# Patient Record
Sex: Female | Born: 1945 | Race: Black or African American | Hispanic: No | Marital: Married | Smoking: Former smoker
Health system: Southern US, Community
[De-identification: ages and names within clinical notes are randomized; demographics above are authoritative.]

## PROBLEM LIST (undated history)

## (undated) DIAGNOSIS — E039 Hypothyroidism, unspecified: Secondary | ICD-10-CM

## (undated) DIAGNOSIS — E876 Hypokalemia: Secondary | ICD-10-CM

## (undated) DIAGNOSIS — E785 Hyperlipidemia, unspecified: Secondary | ICD-10-CM

## (undated) DIAGNOSIS — I214 Non-ST elevation (NSTEMI) myocardial infarction: Secondary | ICD-10-CM

## (undated) DIAGNOSIS — J439 Emphysema, unspecified: Secondary | ICD-10-CM

## (undated) DIAGNOSIS — D649 Anemia, unspecified: Secondary | ICD-10-CM

## (undated) DIAGNOSIS — M1990 Unspecified osteoarthritis, unspecified site: Secondary | ICD-10-CM

## (undated) HISTORY — DX: Anemia, unspecified: D64.9

## (undated) HISTORY — DX: Hyperlipidemia, unspecified: E78.5

## (undated) HISTORY — PX: ABDOMINAL HYSTERECTOMY: SHX81

## (undated) HISTORY — DX: Hypokalemia: E87.6

## (undated) HISTORY — DX: Non-ST elevation (NSTEMI) myocardial infarction: I21.4

---

## 2003-11-30 ENCOUNTER — Encounter: Admission: RE | Admit: 2003-11-30 | Discharge: 2003-12-25 | Payer: Self-pay | Admitting: Unknown Physician Specialty

## 2008-01-16 DIAGNOSIS — M0579 Rheumatoid arthritis with rheumatoid factor of multiple sites without organ or systems involvement: Secondary | ICD-10-CM | POA: Insufficient documentation

## 2008-07-14 DIAGNOSIS — L659 Nonscarring hair loss, unspecified: Secondary | ICD-10-CM | POA: Insufficient documentation

## 2012-01-25 DIAGNOSIS — E039 Hypothyroidism, unspecified: Secondary | ICD-10-CM | POA: Insufficient documentation

## 2015-04-23 ENCOUNTER — Emergency Department (HOSPITAL_COMMUNITY)
Admission: EM | Admit: 2015-04-23 | Discharge: 2015-04-23 | Disposition: A | Discharge status: Home / Self Care | Payer: Medicare Other | Attending: Emergency Medicine | Admitting: Emergency Medicine

## 2015-04-23 ENCOUNTER — Encounter (HOSPITAL_COMMUNITY): Payer: Self-pay

## 2015-04-23 ENCOUNTER — Emergency Department (HOSPITAL_COMMUNITY): Payer: Medicare Other

## 2015-04-23 DIAGNOSIS — M150 Primary generalized (osteo)arthritis: Secondary | ICD-10-CM

## 2015-04-23 DIAGNOSIS — M159 Polyosteoarthritis, unspecified: Secondary | ICD-10-CM | POA: Insufficient documentation

## 2015-04-23 DIAGNOSIS — M5136 Other intervertebral disc degeneration, lumbar region: Secondary | ICD-10-CM | POA: Insufficient documentation

## 2015-04-23 DIAGNOSIS — M549 Dorsalgia, unspecified: Secondary | ICD-10-CM | POA: Diagnosis present

## 2015-04-23 HISTORY — DX: Unspecified osteoarthritis, unspecified site: M19.90

## 2015-04-23 LAB — URINALYSIS, ROUTINE W REFLEX MICROSCOPIC
Bilirubin Urine: NEGATIVE
GLUCOSE, UA: NEGATIVE mg/dL
Ketones, ur: NEGATIVE mg/dL
Leukocytes, UA: NEGATIVE
Nitrite: NEGATIVE
Protein, ur: NEGATIVE mg/dL
UROBILINOGEN UA: 0.2 mg/dL (ref 0.0–1.0)
pH: 6 (ref 5.0–8.0)

## 2015-04-23 LAB — URINE MICROSCOPIC-ADD ON

## 2015-04-23 MED ORDER — PREDNISONE 50 MG PO TABS
60.0000 mg | ORAL_TABLET | Freq: Once | ORAL | Status: AC
  Administered 2015-04-23: 60 mg via ORAL
  Filled 2015-04-23 (×2): qty 1

## 2015-04-23 MED ORDER — HYDROCODONE-ACETAMINOPHEN 5-325 MG PO TABS: 1.0000 | ORAL_TABLET | Freq: Four times a day (QID) | ORAL | Status: DC | PRN

## 2015-04-23 MED ORDER — ONDANSETRON HCL 4 MG PO TABS
4.0000 mg | ORAL_TABLET | Freq: Once | ORAL | Status: AC
  Administered 2015-04-23: 4 mg via ORAL
  Filled 2015-04-23: qty 1

## 2015-04-23 MED ORDER — HYDROCODONE-ACETAMINOPHEN 5-325 MG PO TABS
1.0000 | ORAL_TABLET | Freq: Once | ORAL | Status: AC
  Administered 2015-04-23: 1 via ORAL
  Filled 2015-04-23: qty 1

## 2015-04-23 NOTE — ED Notes (Signed)
Pt made aware to return if symptoms worsen or if any life threatening symptoms occur.   

## 2015-04-23 NOTE — ED Provider Notes (Signed)
CSN: [id number]     Arrival date & time 04/23/15  7616 History   First MD Initiated Contact with Patient 04/23/15 [phone]     Chief Complaint  Patient presents with  . Back Pain     (Consider location/radiation/quality/duration/timing/severity/associated sxs/prior Treatment) HPI Comments: Patient is a 70 year old female who presents to the emergency department with a complaint of back pain.  The patient states that she has problems with arthritis, has required steroidal injections in her shoulder from time to time. She has problems with her back also from time to time, but for the last 2 weeks the pain has been getting progressively worse. The patient denies any change in her pattern of lifting, pushing, pulling. She does not recall doing any heavy lifting. She denies any excessive stooping or bending. She has been using Tylenol but this has not been effective, so she came to the emergency department today for additional evaluation and management. And she denies any difficulty with urination, she denies fever or chills, she has not noticed any temperature changes or unusual numbness or tingling involving her lower extremities.  Patient is a 69 y.o. female presenting with back pain. The history is provided by the patient.  Back Pain Location:  Lumbar spine Associated symptoms: no fever and no numbness     Past Medical History  Diagnosis Date  . Arthritis    Past Surgical History  Procedure Laterality Date  . Abdominal hysterectomy     No family history on file. History  Substance Use Topics  . Smoking status: Never Smoker   . Smokeless tobacco: Not on file  . Alcohol Use: No   OB History    No data available     Review of Systems  Constitutional: Negative for fever.  Musculoskeletal: Positive for back pain and arthralgias.  Neurological: Negative for numbness.  All other systems reviewed and are negative.     Allergies  Review of patient's allergies indicates no known  allergies.  Home Medications   Prior to Admission medications   Not on File   BP 154/83 mmHg  Pulse 84  Temp(Src) 98.3 F (36.8 C) (Oral)  Ht 5\' 6"  (1.676 m)  Wt 212 lb (96.163 kg)  BMI 34.23 kg/m2  SpO2 96% Physical Exam  Constitutional: She is oriented to person, place, and time. She appears well-developed and well-nourished.  Non-toxic appearance.  HENT:  Head: Normocephalic.  Right Ear: Tympanic membrane and external ear normal.  Left Ear: Tympanic membrane and external ear normal.  Eyes: EOM and lids are normal. Pupils are equal, round, and reactive to light.  Neck: Normal range of motion. Neck supple. Carotid bruit is not present.  Cardiovascular: Normal rate, regular rhythm, normal heart sounds, intact distal pulses and normal pulses.   Pulmonary/Chest: Breath sounds normal. No respiratory distress.  Abdominal: Soft. Bowel sounds are normal. There is no tenderness. There is no guarding.  Abdomen is soft. There is no mass or pulsating mass.  Musculoskeletal:       Lumbar back: She exhibits decreased range of motion and tenderness.       Back:  Radial pulses are 2+ and symmetrical. Dorsalis pedis pulses are 2+ and symmetrical. There no temperature changes of the lower extremities.  There is pain to palpation of the lower mid lumbar spine area. There is also left paraspinal area tenderness with change of position and with palpation.  Lymphadenopathy:       Head (right side): No submandibular adenopathy present.  Head (left side): No submandibular adenopathy present.    She has no cervical adenopathy.  Neurological: She is alert and oriented to person, place, and time. She has normal strength. No cranial nerve deficit or sensory deficit.  Gait is steady and intact. No foot drop noted. No changes in balance. Speech is clear and understandable.  Skin: Skin is warm and dry.  Psychiatric: She has a normal mood and affect. Her speech is normal.  Nursing note and vitals  reviewed.   ED Course  Procedures (including critical care time) Labs Review Labs Reviewed  URINALYSIS, ROUTINE W REFLEX MICROSCOPIC (NOT AT [facility])    Imaging Review No results found.   EKG Interpretation None      MDM  Patient has history of arthritis. She now has increasing pain of the lower back. His been no loss of control of bowel or bladder function. Urinalysis was within normal limits. No evidence of infection or kidney stone.  The x-ray of the lumbar spine reveals spondylosis, with unknown degenerative changes at the L5-S1, L3-L4, and L2-L3 areas. There was also noted some aortic atherosclerosis. I have discussed these findings with the patient in terms which he understands. She will follow with her primary physician. I have encouraged her to develop a treatment plan and pain management plan with her doctor. The patient is using Tylenol and she states this is not helping. I am reluctant to start her on additional anti-inflammatory medications. Patient is advised to use Tylenol every 4 hours for mild pain, prescription for Norco given for more severe pain. I have instructed the patient to discuss this with her primary physician to develop a plan for pain management. Patient is in agreement with this discharge plan.    Final diagnoses:  None    *I have reviewed nursing notes, vital signs, and all appropriate lab and imaging results for this patient.[address]    Ivery Quale, PA-C 04/23/15 1124  Raeford Razor, MD 04/24/15 [phone]

## 2015-04-23 NOTE — Discharge Instructions (Signed)
Your urine test is negative for infection or kidney stone. 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 Please discuss this with your primary physician. Please also discuss pain management with your primary physician. You may use Tylenol Extra Strength every 4 hours. You may use Norco for pain not improved by the Tylenol Extra Strength. Norco may cause drowsiness, please use this medication with caution. Degenerative Disk Disease Degenerative disk disease is a condition caused by the changes that occur in the cushions of the backbone (spinal disks) as you grow older. Spinal disks are soft and compressible disks located between the bones of the spine (vertebrae). They act like shock absorbers. Degenerative disk disease can affect the whole spine. However, the neck and lower back are most commonly affected. Many changes can occur in the spinal disks with aging, such as:  The spinal disks may dry and shrink.  Small tears may occur in the tough, outer covering of the disk (annulus).  The disk space may become smaller due to loss of water.  Abnormal growths in the bone (spurs) may occur. This can put pressure on the nerve roots exiting the spinal canal, causing pain.  The spinal canal may become narrowed. CAUSES  Degenerative disk disease is a condition caused by the changes that occur in the spinal disks with aging. The exact cause is not known, but there is a genetic basis for many patients. Degenerative changes can occur due to loss of fluid in the disk. This makes the disk thinner and reduces the space between the backbones. Small cracks can develop in the outer layer of the disk. This can lead to the breakdown of the disk. You are more likely to get degenerative disk disease if you are overweight. Smoking cigarettes and doing heavy work such as weightlifting can also increase your risk of this condition. Degenerative changes can start after a sudden injury.  Growth of bone spurs can compress the nerve roots and cause pain.  SYMPTOMS  The symptoms vary from person to person. Some people may have no pain, while others have severe pain. The pain may be so severe that it can limit your activities. The location of the pain depends on the part of your backbone that is affected. You will have neck or arm pain if a disk in the neck area is affected. You will have pain in your back, buttocks, or legs if a disk in the lower back is affected. The pain becomes worse while bending, reaching up, or with twisting movements. The pain may start gradually and then get worse as time passes. It may also start after a major or minor injury. You may feel numbness or tingling in the arms or legs.  DIAGNOSIS  Your caregiver will ask you about your symptoms and about activities or habits that may cause the pain. He or she may also ask about any injuries, diseases, or treatments you have had earlier. Your caregiver will examine you to check for the range of movement that is possible in the affected area, to check for strength in your extremities, and to check for sensation in the areas of the arms and legs supplied by different nerve roots. An X-ray of the spine may be taken. Your caregiver may suggest other imaging tests, such as magnetic resonance imaging (MRI), if needed.  TREATMENT  Treatment includes rest, modifying your activities, and applying ice and heat. Your caregiver may prescribe medicines to reduce your pain and may  ask you to do some exercises to strengthen your back. In some cases, you may need surgery. You and your caregiver will decide on the treatment that is best for you. HOME CARE INSTRUCTIONS   Follow proper lifting and walking techniques as advised by your caregiver.  Maintain good posture.  Exercise regularly as advised.  Perform relaxation exercises.  Change your sitting, standing, and sleeping habits as advised. Change positions frequently.  Lose  weight as advised.  Stop smoking if you smoke.  Wear supportive footwear. SEEK MEDICAL CARE IF:  Your pain does not go away within 1 to 4 weeks. SEEK IMMEDIATE MEDICAL CARE IF:   Your pain is severe.  You notice weakness in your arms, hands, or legs.  You begin to lose control of your bladder or bowel movements. MAKE SURE YOU:   Understand these instructions.  Will watch your condition.  Will get help right away if you are not doing well or get worse. Document Released: 09/10/2007 Document Revised: 02/05/2012 Document Reviewed: 03/17/2014 [facility] Patient Information 2015 [place], [place]. This information is not intended to replace advice given to you by your health care provider. Make sure you discuss any questions you have with your health care provider.  Arthritis, Nonspecific Arthritis is pain, redness, warmth, or puffiness (inflammation) of a joint. The joint may be stiff or hurt when you move it. One or more joints may be affected. There are many types of arthritis. Your doctor may not know what type you have right away. The most common cause of arthritis is wear and tear on the joint (osteoarthritis). HOME CARE   Only take medicine as told by your doctor.  Rest the joint as much as possible.  Raise (elevate) your joint if it is puffy.  Use crutches if the painful joint is in your leg.  Drink enough fluids to keep your pee (urine) clear or pale yellow.  Follow your doctor's diet instructions.  Use cold packs for very bad joint pain for 10 to 15 minutes every hour. Ask your doctor if it is okay for you to use hot packs.  Exercise as told by your doctor.  Take a warm shower if you have stiffness in the morning.  Move your sore joints throughout the day. GET HELP RIGHT AWAY IF:   You have a fever.  You have very bad joint pain, puffiness, or redness.  You have many joints that are painful and puffy.  You are not getting better with treatment.  You have very  bad back pain or leg weakness.  You cannot control when you poop (bowel movement) or pee (urinate).  You do not feel better in 24 hours or are getting worse.  You are having side effects from your medicine. MAKE SURE YOU:   Understand these instructions.  Will watch your condition.  Will get help right away if you are not doing well or get worse. Document Released: 02/07/2010 Document Revised: 05/14/2012 Document Reviewed: 02/07/2010 [facility] Patient Information 2015 [place], [place]. This information is not intended to replace advice given to you by your health care provider. Make sure you discuss any questions you have with your health care provider.

## 2015-04-23 NOTE — ED Notes (Signed)
Pt states she has arthritis. She recently had her right shoulder injected for same. States her low back has been hurting for two weeks and tylenol is not helping.

## 2017-12-03 ENCOUNTER — Inpatient Hospital Stay (HOSPITAL_COMMUNITY)
Admission: EM | Admit: 2017-12-03 | Discharge: 2017-12-03 | DRG: 282 | Disposition: A | Discharge status: Home / Self Care | Payer: Medicare HMO | Attending: Cardiology | Admitting: Cardiology

## 2017-12-03 ENCOUNTER — Encounter (HOSPITAL_COMMUNITY)
Admission: EM | Disposition: A | Discharge status: Home / Self Care | Payer: Self-pay | Source: Home / Self Care | Attending: Cardiology

## 2017-12-03 ENCOUNTER — Emergency Department (HOSPITAL_COMMUNITY): Payer: Medicare HMO

## 2017-12-03 ENCOUNTER — Other Ambulatory Visit: Payer: Self-pay

## 2017-12-03 ENCOUNTER — Encounter (HOSPITAL_COMMUNITY): Payer: Self-pay | Admitting: *Deleted

## 2017-12-03 DIAGNOSIS — E663 Overweight: Secondary | ICD-10-CM | POA: Diagnosis not present

## 2017-12-03 DIAGNOSIS — Z6831 Body mass index (BMI) 31.0-31.9, adult: Secondary | ICD-10-CM | POA: Diagnosis not present

## 2017-12-03 DIAGNOSIS — E039 Hypothyroidism, unspecified: Secondary | ICD-10-CM | POA: Diagnosis present

## 2017-12-03 DIAGNOSIS — Z90710 Acquired absence of both cervix and uterus: Secondary | ICD-10-CM | POA: Diagnosis not present

## 2017-12-03 DIAGNOSIS — M069 Rheumatoid arthritis, unspecified: Secondary | ICD-10-CM | POA: Diagnosis not present

## 2017-12-03 DIAGNOSIS — I214 Non-ST elevation (NSTEMI) myocardial infarction: Secondary | ICD-10-CM | POA: Diagnosis not present

## 2017-12-03 DIAGNOSIS — Z8249 Family history of ischemic heart disease and other diseases of the circulatory system: Secondary | ICD-10-CM | POA: Diagnosis not present

## 2017-12-03 DIAGNOSIS — I119 Hypertensive heart disease without heart failure: Secondary | ICD-10-CM | POA: Diagnosis not present

## 2017-12-03 DIAGNOSIS — E785 Hyperlipidemia, unspecified: Secondary | ICD-10-CM | POA: Diagnosis present

## 2017-12-03 DIAGNOSIS — Z87891 Personal history of nicotine dependence: Secondary | ICD-10-CM | POA: Diagnosis not present

## 2017-12-03 DIAGNOSIS — E876 Hypokalemia: Secondary | ICD-10-CM | POA: Diagnosis not present

## 2017-12-03 DIAGNOSIS — D649 Anemia, unspecified: Secondary | ICD-10-CM | POA: Diagnosis present

## 2017-12-03 HISTORY — PX: LEFT HEART CATH AND CORONARY ANGIOGRAPHY: CATH118249

## 2017-12-03 HISTORY — DX: Hypothyroidism, unspecified: E03.9

## 2017-12-03 LAB — BASIC METABOLIC PANEL
ANION GAP: 11 (ref 5–15)
BUN: 11 mg/dL (ref 6–20)
CALCIUM: 9.8 mg/dL (ref 8.9–10.3)
CO2: 25 mmol/L (ref 22–32)
Chloride: 102 mmol/L (ref 101–111)
Creatinine, Ser: 0.89 mg/dL (ref 0.44–1.00)
GFR calc Af Amer: >60 mL/min (ref >60)
GFR calc non Af Amer: >60 mL/min (ref >60)
Glucose, Bld: 116 mg/dL — ABNORMAL HIGH (ref 65–99)
Potassium: 3.3 mmol/L — ABNORMAL LOW (ref 3.5–5.1)
Sodium: 138 mmol/L (ref 135–145)

## 2017-12-03 LAB — CBC
HCT: 37.5 % (ref 36.0–46.0)
HEMOGLOBIN: 11.6 g/dL — AB (ref 12.0–15.0)
MCH: 26.3 pg (ref 26.0–34.0)
MCHC: 30.9 g/dL (ref 30.0–36.0)
MCV: 85 fL (ref 78.0–100.0)
Platelets: 317 10*3/uL (ref 150–400)
RBC: 4.41 MIL/uL (ref 3.87–5.11)
RDW: 16.7 % — ABNORMAL HIGH (ref 11.5–15.5)
WBC: 5.1 10*3/uL (ref 4.0–10.5)

## 2017-12-03 LAB — TROPONIN I
TROPONIN I: 1.52 ng/mL — AB (ref <0.03)
TROPONIN I: 1.67 ng/mL — AB (ref <0.03)
Troponin I: 1.4 ng/mL (ref <0.03)

## 2017-12-03 LAB — PROTIME-INR
INR: 0.96
PROTHROMBIN TIME: 12.7 s (ref 11.4–15.2)

## 2017-12-03 LAB — HEPARIN LEVEL (UNFRACTIONATED): Heparin Unfractionated: 0.37 IU/mL (ref 0.30–0.70)

## 2017-12-03 SURGERY — LEFT HEART CATH AND CORONARY ANGIOGRAPHY
Anesthesia: LOCAL

## 2017-12-03 MED ORDER — SODIUM CHLORIDE 0.9 % IV SOLN: 250.0000 mL | INTRAVENOUS | Status: DC | PRN

## 2017-12-03 MED ORDER — MIDAZOLAM HCL 2 MG/2ML IJ SOLN
INTRAMUSCULAR | Status: DC | PRN
  Administered 2017-12-03: 1 mg via INTRAVENOUS

## 2017-12-03 MED ORDER — ASPIRIN 81 MG PO CHEW: 81.0000 mg | CHEWABLE_TABLET | Freq: Every day | ORAL | Status: DC

## 2017-12-03 MED ORDER — HEPARIN (PORCINE) IN NACL 2-0.9 UNIT/ML-% IJ SOLN
INTRAMUSCULAR | Status: AC | PRN
  Administered 2017-12-03: 1000 mL

## 2017-12-03 MED ORDER — HEPARIN SODIUM (PORCINE) 1000 UNIT/ML IJ SOLN
INTRAMUSCULAR | Status: AC
  Filled 2017-12-03: qty 1

## 2017-12-03 MED ORDER — ONDANSETRON HCL 4 MG/2ML IJ SOLN: 4.0000 mg | Freq: Four times a day (QID) | INTRAMUSCULAR | Status: DC | PRN

## 2017-12-03 MED ORDER — MORPHINE SULFATE (PF) 2 MG/ML IV SOLN: 2.0000 mg | INTRAVENOUS | Status: DC | PRN

## 2017-12-03 MED ORDER — ACETAMINOPHEN 325 MG PO TABS: 650.0000 mg | ORAL_TABLET | ORAL | Status: DC | PRN

## 2017-12-03 MED ORDER — SODIUM CHLORIDE 0.9 % WEIGHT BASED INFUSION: 3.0000 mL/kg/h | INTRAVENOUS | Status: DC

## 2017-12-03 MED ORDER — ASPIRIN EC 81 MG PO TBEC: 81.0000 mg | DELAYED_RELEASE_TABLET | Freq: Every day | ORAL | Status: DC

## 2017-12-03 MED ORDER — HEPARIN BOLUS VIA INFUSION
4000.0000 [IU] | Freq: Once | INTRAVENOUS | Status: AC
  Administered 2017-12-03: 4000 [IU] via INTRAVENOUS

## 2017-12-03 MED ORDER — NITROGLYCERIN 0.4 MG SL SUBL: 0.4000 mg | SUBLINGUAL_TABLET | SUBLINGUAL | Status: DC | PRN

## 2017-12-03 MED ORDER — TIZANIDINE HCL 4 MG PO TABS: 4.0000 mg | ORAL_TABLET | Freq: Two times a day (BID) | ORAL | Status: DC

## 2017-12-03 MED ORDER — PANTOPRAZOLE SODIUM 20 MG PO TBEC
20.0000 mg | DELAYED_RELEASE_TABLET | Freq: Every day | ORAL | Status: DC
  Administered 2017-12-03: 20 mg via ORAL
  Filled 2017-12-03: qty 1

## 2017-12-03 MED ORDER — LEVOTHYROXINE SODIUM 25 MCG PO TABS: 25.0000 ug | ORAL_TABLET | Freq: Every day | ORAL | Status: DC

## 2017-12-03 MED ORDER — METOPROLOL TARTRATE 12.5 MG HALF TABLET: 12.5000 mg | ORAL_TABLET | Freq: Two times a day (BID) | ORAL | Status: DC

## 2017-12-03 MED ORDER — VERAPAMIL HCL 2.5 MG/ML IV SOLN
INTRA_ARTERIAL | Status: DC | PRN
  Administered 2017-12-03: 10 mL via INTRA_ARTERIAL

## 2017-12-03 MED ORDER — IOPAMIDOL (ISOVUE-370) INJECTION 76%
INTRAVENOUS | Status: DC | PRN
  Administered 2017-12-03: 90 mL via INTRA_ARTERIAL

## 2017-12-03 MED ORDER — PNEUMOCOCCAL VAC POLYVALENT 25 MCG/0.5ML IJ INJ: 0.5000 mL | INJECTION | INTRAMUSCULAR | Status: DC

## 2017-12-03 MED ORDER — ACETAMINOPHEN 325 MG PO TABS
650.0000 mg | ORAL_TABLET | ORAL | Status: DC | PRN
Start: 2017-12-03 — End: 2017-12-04

## 2017-12-03 MED ORDER — HEPARIN (PORCINE) IN NACL 100-0.45 UNIT/ML-% IJ SOLN
12.0000 [IU]/kg/h | INTRAMUSCULAR | Status: DC
  Administered 2017-12-03: 12 [IU]/kg/h via INTRAVENOUS
  Filled 2017-12-03: qty 250

## 2017-12-03 MED ORDER — FENTANYL CITRATE (PF) 100 MCG/2ML IJ SOLN
INTRAMUSCULAR | Status: DC | PRN
  Administered 2017-12-03: 25 ug via INTRAVENOUS

## 2017-12-03 MED ORDER — ATORVASTATIN CALCIUM 80 MG PO TABS
80.0000 mg | ORAL_TABLET | Freq: Every day | ORAL | Status: DC
  Administered 2017-12-03: 80 mg via ORAL
  Filled 2017-12-03: qty 1

## 2017-12-03 MED ORDER — LIDOCAINE HCL (PF) 1 % IJ SOLN
INTRAMUSCULAR | Status: AC
  Filled 2017-12-03: qty 30

## 2017-12-03 MED ORDER — METOPROLOL TARTRATE 25 MG PO TABS: 12.5000 mg | ORAL_TABLET | Freq: Two times a day (BID) | ORAL | 5 refills | Status: DC

## 2017-12-03 MED ORDER — HEPARIN (PORCINE) IN NACL 2-0.9 UNIT/ML-% IJ SOLN
INTRAMUSCULAR | Status: AC
  Filled 2017-12-03: qty 1000

## 2017-12-03 MED ORDER — POTASSIUM CHLORIDE CRYS ER 20 MEQ PO TBCR
40.0000 meq | EXTENDED_RELEASE_TABLET | Freq: Once | ORAL | Status: AC
  Administered 2017-12-03: 40 meq via ORAL
  Filled 2017-12-03: qty 2

## 2017-12-03 MED ORDER — ATORVASTATIN CALCIUM 80 MG PO TABS: 80.0000 mg | ORAL_TABLET | Freq: Every day | ORAL | Status: DC

## 2017-12-03 MED ORDER — VERAPAMIL HCL 2.5 MG/ML IV SOLN
INTRAVENOUS | Status: AC
  Filled 2017-12-03: qty 2

## 2017-12-03 MED ORDER — LIDOCAINE HCL (PF) 1 % IJ SOLN
INTRAMUSCULAR | Status: DC | PRN
  Administered 2017-12-03: 5 mL

## 2017-12-03 MED ORDER — HYDROCODONE-ACETAMINOPHEN 5-325 MG PO TABS: 1.0000 | ORAL_TABLET | Freq: Four times a day (QID) | ORAL | Status: DC | PRN

## 2017-12-03 MED ORDER — SODIUM CHLORIDE 0.9% FLUSH: 3.0000 mL | Freq: Two times a day (BID) | INTRAVENOUS | Status: DC

## 2017-12-03 MED ORDER — MIDAZOLAM HCL 2 MG/2ML IJ SOLN
INTRAMUSCULAR | Status: AC
  Filled 2017-12-03: qty 2

## 2017-12-03 MED ORDER — FENTANYL CITRATE (PF) 100 MCG/2ML IJ SOLN
INTRAMUSCULAR | Status: AC
Start: 2017-12-03 — End: 2017-12-03
  Filled 2017-12-03: qty 2

## 2017-12-03 MED ORDER — MORPHINE SULFATE (PF) 4 MG/ML IV SOLN
4.0000 mg | Freq: Once | INTRAVENOUS | Status: AC
  Administered 2017-12-03: 4 mg via INTRAVENOUS
  Filled 2017-12-03: qty 1

## 2017-12-03 MED ORDER — SODIUM CHLORIDE 0.9% FLUSH: 3.0000 mL | INTRAVENOUS | Status: DC | PRN

## 2017-12-03 MED ORDER — IOPAMIDOL (ISOVUE-370) INJECTION 76%
INTRAVENOUS | Status: AC
  Filled 2017-12-03: qty 100

## 2017-12-03 MED ORDER — HEPARIN SODIUM (PORCINE) 1000 UNIT/ML IJ SOLN
INTRAMUSCULAR | Status: DC | PRN
  Administered 2017-12-03: 4500 [IU] via INTRAVENOUS

## 2017-12-03 MED ORDER — ASPIRIN 81 MG PO CHEW
324.0000 mg | CHEWABLE_TABLET | Freq: Once | ORAL | Status: AC
Start: 2017-12-03 — End: 2017-12-03
  Administered 2017-12-03: 324 mg via ORAL
  Filled 2017-12-03: qty 4

## 2017-12-03 MED ORDER — HEPARIN (PORCINE) IN NACL 100-0.45 UNIT/ML-% IJ SOLN: 950.0000 [IU]/h | INTRAMUSCULAR | Status: DC

## 2017-12-03 MED ORDER — SODIUM CHLORIDE 0.9 % IV SOLN: INTRAVENOUS | Status: AC

## 2017-12-03 MED ORDER — NITROGLYCERIN 1 MG/10 ML FOR IR/CATH LAB
INTRA_ARTERIAL | Status: AC
  Filled 2017-12-03: qty 10

## 2017-12-03 MED ORDER — SODIUM CHLORIDE 0.9 % WEIGHT BASED INFUSION: 1.0000 mL/kg/h | INTRAVENOUS | Status: DC

## 2017-12-03 SURGICAL SUPPLY — 13 items
CATH INFINITI 5FR ANG PIGTAIL (CATHETERS) ×2 IMPLANT
CATH OPTITORQUE TIG 4.0 5F (CATHETERS) ×2 IMPLANT
DEVICE RAD COMP TR BAND LRG (VASCULAR PRODUCTS) ×2 IMPLANT
GLIDESHEATH SLEND A-KIT 6F 22G (SHEATH) ×2 IMPLANT
GUIDEWIRE ANGLED .035X150CM (WIRE) ×2 IMPLANT
GUIDEWIRE INQWIRE 1.5J.035X260 (WIRE) ×1 IMPLANT
INQWIRE 1.5J .035X260CM (WIRE) ×2
KIT HEART LEFT (KITS) ×2 IMPLANT
PACK CARDIAC CATHETERIZATION (CUSTOM PROCEDURE TRAY) ×2 IMPLANT
SYR MEDRAD MARK V 150ML (SYRINGE) ×2 IMPLANT
TRANSDUCER W/STOPCOCK (MISCELLANEOUS) ×2 IMPLANT
TUBING CIL FLEX 10 FLL-RA (TUBING) ×2 IMPLANT
WIRE HI TORQ VERSACORE-J 145CM (WIRE) ×2 IMPLANT

## 2017-12-03 NOTE — H&P (Signed)
History & Physical    Patient ID: GWENNYTH NIEDRINGHAUS MRN: [id number], DOB/AGE: 1946-04-08   Admit date: 12/03/2017  Primary Care Provider: Patient, No Pcp Per Evaluating Cardiologist: New to [facility] - Dr. Diona Browner  Chief Complaint: Chest Pain  Patient Profile    MERRELL LUTTRELL is a 72 y.o. female with past medical history of Hypothyroidism, HLD (diet-controlled), and prior tobacco use who is being evaluated today for an NSTEMI at the request of Dr. Ethelda Chick.   History of Present Illness    CHARICE KAGLE reports being being in her usual state of health until yesterday morning when she developed substernal chest discomfort. She describes this as a tightness which radiated across her entire pectoral region. She initially thought the discomfort was secondary to reflux and took Tums with no improvement in her symptoms. The pain persisted throughout the day, therefore she came to the emergency department for further evaluation. She denies any associated dyspnea, diaphoresis, or vomiting. Did have mild nausea. Pain significantly improved with administration of ASA and sublingual nitroglycerin upon arrival to the ED.  She denies any prior history of CAD. No known HTN or Type 2 DM.  No known family history of CAD. She is a prior smoker but quit in 1990.  No prior alcohol use or recreational drug use.  Initial labs in the ED show WBC 5.1, Hgb 11.6, and platelets 317. Na+ 138, K+ 3.3, and creatinine 0.89. Initial troponin 1.52 with repeat of 1.67. CXR with no acute cardiopulmonary abnormalities. EKG shows NSR, HR 72, with no acute ST or T-wave changes.    Past Medical History:  Diagnosis Date  . Arthritis   . Hypothyroidism     Past Surgical History:  Procedure Laterality Date  . ABDOMINAL HYSTERECTOMY       Medications Prior to Admission: Prior to Admission medications   Medication Sig Start Date End Date Taking? Authorizing Provider  acetaminophen (TYLENOL) 500 MG tablet Take 1,000 mg  by mouth every 6 (six) hours as needed for moderate pain.    [provider]  folic acid (FOLVITE) 1 MG tablet Take 1 tablet by mouth daily. 04/10/15   [provider]  HYDROcodone-acetaminophen (NORCO/VICODIN) 5-325 MG per tablet Take 1 tablet by mouth every 6 (six) hours as needed. 04/23/15   Ivery Quale, PA-C  levothyroxine (SYNTHROID, LEVOTHROID) 25 MCG tablet Take 25 mcg by mouth daily. 03/30/15   [provider]  methotrexate (RHEUMATREX) 2.5 MG tablet Take 15 mg by mouth once a week. Take on Sunday. Caution:Chemotherapy. Protect from light.    [provider]  pantoprazole (PROTONIX) 20 MG tablet Take 1 tablet by mouth daily. 04/15/15   [provider]  tiZANidine (ZANAFLEX) 4 MG tablet Take 1 tablet by mouth 2 (two) times daily. 04/12/15   [provider]     Allergies:   No Known Allergies  Social History:   Social History   Socioeconomic History  . Marital status: Married    Spouse name: Not on file  . Number of children: Not on file  . Years of education: Not on file  . Highest education level: Not on file  Social Needs  . Financial resource strain: Not on file  . Food insecurity - worry: Not on file  . Food insecurity - inability: Not on file  . Transportation needs - medical: Not on file  . Transportation needs - non-medical: Not on file  Occupational History  . Not on file  Tobacco Use  .  Smoking status: Former Smoker    Last attempt to quit: 12/03/1988    Years since quitting: 29.0  . Smokeless tobacco: Never Used  Substance and Sexual Activity  . Alcohol use: No  . Drug use: No  . Sexual activity: Not on file  Other Topics Concern  . Not on file  Social History Narrative  . Not on file      Family History:  The patient's family history includes Hypertension in her mother.     Review of Systems    General:  No chills, fever, night sweats or weight changes.  Cardiovascular:  No dyspnea on exertion,  edema, orthopnea, palpitations, paroxysmal nocturnal dyspnea. Positive for chest pain. Dermatological: No rash, lesions/masses Respiratory: No cough, dyspnea Urologic: No hematuria, dysuria Abdominal:   No nausea, vomiting, diarrhea, bright red blood per rectum, melena, or hematemesis Neurologic:  No visual changes, wkns, changes in mental status. All other systems reviewed and are otherwise negative except as noted above.  Physical Exam    Vitals:   12/03/17 0600 12/03/17 0630 12/03/17 0700 12/03/17 0730  BP: (!) 144/92 (!) 149/95 (!) 157/91 (!) 146/87  Pulse: 60 64 66 63  Resp: 16 16 14 15   Temp:      TempSrc:      SpO2: 95% 96% 96% 96%  Weight:      Height:       No intake or output data in the 24 hours ending 12/03/17 0924 Filed Weights   12/03/17 0042  Weight: 190 lb (86.2 kg)   Body mass index is 30.67 kg/m.   General: Well developed, well nourished African American female appearing  in no acute distress. Head: Normocephalic, atraumatic, sclera non-icteric, no xanthomas, nares are without discharge. Dentition:  Neck: No carotid bruits. JVD not elevated.  Lungs: Respirations regular and unlabored, without wheezes or rales.  Heart: Regular rate and rhythm. No S3 or S4.  No murmur, no rubs, or gallops appreciated. Abdomen: Soft, non-tender, non-distended with normoactive bowel sounds. No hepatomegaly. No rebound/guarding. No obvious abdominal masses. Msk:  Strength and tone appear normal for age. No joint deformities or effusions. Extremities: No clubbing or cyanosis. No edema.  Distal pedal pulses are 2+ bilaterally. Neuro: Alert and oriented X 3. Moves all extremities spontaneously. No focal deficits noted. Psych:  Responds to questions appropriately with a normal affect. Skin: No rashes or lesions noted  Labs and Radiology Studies    EKG:  The ECG that was done was personally reviewed and demonstrates NSR, H 72, with no acute ST or T-wave changes.   Relevant CV  Studies:  None available for review.   Laboratory Data:  Chemistry Recent Labs  Lab 12/03/17 0131  NA 138  K 3.3*  CL 102  CO2 25  GLUCOSE 116*  BUN 11  CREATININE 0.89  CALCIUM 9.8  GFRNONAA >60  GFRAA >60  ANIONGAP 11    No results for input(s): PROT, ALBUMIN, AST, ALT, ALKPHOS, BILITOT in the last 168 hours. Hematology Recent Labs  Lab 12/03/17 0131  WBC 5.1  RBC 4.41  HGB 11.6*  HCT 37.5  MCV 85.0  MCH 26.3  MCHC 30.9  RDW 16.7*  PLT 317   Cardiac Enzymes Recent Labs  Lab 12/03/17 0131 12/03/17 0820  TROPONINI 1.52* 1.67*   No results for input(s): TROPIPOC in the last 168 hours.  BNPNo results for input(s): BNP, PROBNP in the last 168 hours.  DDimer No results for input(s): DDIMER in the last 168 hours.  Radiology/Studies:  Dg Chest 2 View  Result Date: 12/03/2017 CLINICAL DATA:  Chest pain, onset yesterday. EXAM: CHEST  2 VIEW COMPARISON:  12/28/2004 FINDINGS: The cardiomediastinal contours are normal. Subsegmental left lung base atelectasis or scarring. Pulmonary vasculature is normal. No consolidation, pleural effusion, or pneumothorax. No acute osseous abnormalities are seen. IMPRESSION: No acute abnormality. Electronically Signed   By: Rubye Oaks M.D.   On: 12/03/2017 01:38    Assessment and Plan:   1. NSTEMI - the patient developed substernal chest discomfort yesterday morning which was present throughout the day, exacerbated by exertion. No association with positional changes. Improved with administration of SL NTG and ASA.  - she denies any known history of CAD, HTN, Type 2 DM or family history of CAD. Is a former smoker with a 30+ pack-year history.  -  Initial troponin 1.52 with repeat of 1.67. EKG shows NSR, HR 72, with no acute ST or T-wave changes.  - With her presenting symptoms and elevated cardiac enzymes, will plan for transfer to [facility] for a cardiac catheterization. The patient understands that risks included but are not  limited to stroke (1 in 1000), death (1 in 1000), kidney failure [usually temporary] (1 in 500), bleeding (1 in 200), allergic reaction [possibly serious] (1 in 200). She has been started on IV Heparin. Will start high-intensity statin and BB therapy.   2. HLD - diet-controlled by the patients report. Will start Atorvastatin 80mg  daily.  - check FLP and LFT's.   3. Hypokalemia - K+ 3.3. Has been replaced.  - repeat BMET in AM.    For questions or updates, please contact [facility] Please consult [email or web address] for contact info under Cardiology/STEMI.   Signed, , PA-C 12/03/2017, 9:24 AM Pager: [phone]   Attending note:  Patient seen and examined. Reviewed records and discussed the case with the [facility]. Patient presents with recent onset chest discomfort, began in church yesterday at rest, waxing and waning since that time but pain-free since evaluation in the ER with aspirin and nitroglycerin. ECG shows no acute ST segment changes, but troponin I is elevated at 1.5-1.67 consistent with NSTEMI. She has diet managed hyperlipidemia but no other major cardiac risk factors.  On examination she is comfortable, heart rate in the 60s to 70s in sinus rhythm by telemetry which I personally reviewed. Systolic blood pressure 130s to 160s. Lungs are clear without labored breathing. Cardiac exam reveals RRR without gallop. Distal pulses are full. Lab work shows creatinine 0.89, most recent troponin I 1.67, hemoglobin 11.6, platelets 317. Chest x-ray shows no acute process.  Plan is for transfer to [facility] in the setting of NSTEMI for diagnostic cardiac catheterization with an eye toward revascularization options. Risks and benefits discussed with the patient and she is in agreement to proceed. Continue aspirin, heparin, starting high-dose statin, starting beta blocker. Keep nothing by mouth for now.  [facility], M.D., F.A.C.C.

## 2017-12-03 NOTE — Progress Notes (Signed)
ANTICOAGULATION CONSULT NOTE  Pharmacy Consult for Heparin Pt is a 72 yo female admitted for intermittant chest pain which began yesterday morning. Pain is midsternum radiating to the back.  Elevated troponin indicates NSTEMI.  She also presents with mild hypokalemia.  No Known Allergies  Patient Measurements: Height: 5\' 6"  (167.6 cm) Weight: 190 lb (86.2 kg) IBW/kg (Calculated) : 59.3 Heparin Dosing Weight:=77.68 kg  Vital Signs: Temp: 97.9 F (36.6 C) (01/07 0041) Temp Source: Oral (01/07 0041) BP: 154/87 (01/07 1130) Pulse Rate: 63 (01/07 1130)  Labs: Recent Labs    12/03/17 0131 12/03/17 0820  HGB 11.6*  --   HCT 37.5  --   PLT 317  --   LABPROT 12.7  --   INR 0.96  --   HEPARINUNFRC  --  0.37  CREATININE 0.89  --   TROPONINI 1.52* 1.67*    Estimated Creatinine Clearance: 66 mL/min (by C-G formula based on SCr of 0.89 mg/dL).   Medical History: Past Medical History:  Diagnosis Date  . Arthritis   . Hypothyroidism     Medications:  Aspirin 324mg  po x1 Beta blocker and statin ordered for admission.  Assessment: NSTEMI, Heparin level at goal, no bleeding noted.  Tx to [facility] planned for further cardiac evaluation.  Goal of Therapy: Heparin level=0.3-0.6  Monitor platelets by anticoagulation protocol: Yes    Plan:  Continue Heparin drip at 950 units/hr Check daily Heparin level & CBC  Monitor for signs and symptoms of bleeding.   R 12/03/2017,11:55 AM

## 2017-12-03 NOTE — ED Notes (Signed)
Pt placed on 2L o2 for comfort

## 2017-12-03 NOTE — Progress Notes (Signed)
Received pt from [facility] for a Cardiac  Cath Procedure.  Pt alert, oriented and denies any CP at this time.  IVF infusing and heparin.  No acute distress noted. Pt to lab for procedure. Consent signed.

## 2017-12-03 NOTE — ED Notes (Signed)
Date and time results received: 12/03/17 0242 (use smartphrase ".now" to insert current time)  Test: troponin Critical Value: 1.52  Name of Provider Notified: Dr. Preston Fleeting at [phone]  Orders Received? Or Actions Taken?: pt placed in room

## 2017-12-03 NOTE — ED Notes (Signed)
Date and time results received: 12/03/17 9:19 AM  (use smartphrase ".now" to insert current time)  Test: Troponin Critical Value: 1.67  Name of Provider Notified: Jacobowitz  Orders Received? Or Actions Taken?: Orders Received - See Orders for details

## 2017-12-03 NOTE — ED Provider Notes (Signed)
[facility] EMERGENCY DEPARTMENT Provider Note   CSN: [id number] Arrival date & time: 12/03/17  0029     History   Chief Complaint Chief Complaint  Patient presents with  . Chest Pain    HPI Claudia Lewis is a 72 y.o. female.  The history is provided by the patient.  She has a history of rheumatoid arthritis.  She comes in with intermittent chest pain throughout the day today.  Pain is sharp and she rates it at 8/10 with present.  There is no associated dyspnea or diaphoresis.  There was transient nausea which has resolved.  Nothing makes her pain better, nothing makes it worse.  She has not taken anything for it.  She has never had pain like this before.  She is a non-smoker and denies history of hypertension, diabetes, hyperlipidemia.  There is no family history of coronary artery disease.  Past Medical History:  Diagnosis Date  . Arthritis     There are no active problems to display for this patient.   Past Surgical History:  Procedure Laterality Date  . ABDOMINAL HYSTERECTOMY      OB History    No data available       Home Medications    Prior to Admission medications   Medication Sig Start Date End Date Taking? Authorizing Provider  acetaminophen (TYLENOL) 500 MG tablet Take 1,000 mg by mouth every 6 (six) hours as needed for moderate pain.    [provider]  folic acid (FOLVITE) 1 MG tablet Take 1 tablet by mouth daily. 04/10/15   [provider]  HYDROcodone-acetaminophen (NORCO/VICODIN) 5-325 MG per tablet Take 1 tablet by mouth every 6 (six) hours as needed. 04/23/15   Ivery Quale, PA-C  levothyroxine (SYNTHROID, LEVOTHROID) 25 MCG tablet Take 25 mcg by mouth daily. 03/30/15   [provider]  methotrexate (RHEUMATREX) 2.5 MG tablet Take 15 mg by mouth once a week. Take on Sunday. Caution:Chemotherapy. Protect from light.    [provider]  pantoprazole (PROTONIX) 20 MG tablet Take 1 tablet by mouth daily. 04/15/15    [provider]  tiZANidine (ZANAFLEX) 4 MG tablet Take 1 tablet by mouth 2 (two) times daily. 04/12/15   [provider]    Family History History reviewed. No pertinent family history.  Social History Social History   Tobacco Use  . Smoking status: Never Smoker  . Smokeless tobacco: Never Used  Substance Use Topics  . Alcohol use: No  . Drug use: No     Allergies   Patient has no known allergies.   Review of Systems Review of Systems  All other systems reviewed and are negative.    Physical Exam Updated Vital Signs BP (!) 171/85 (BP Location: Right Arm)   Pulse 65   Temp 97.9 F (36.6 C) (Oral)   Resp 18   Ht 5\' 6"  (1.676 m)   Wt 86.2 kg (190 lb)   SpO2 97%   BMI 30.67 kg/m   Physical Exam  Nursing note and vitals reviewed.  72 year old female, resting comfortably and in no acute distress. Vital signs are significant for hypertension. Oxygen saturation is 97%, which is normal. Head is normocephalic and atraumatic. PERRLA, EOMI. Oropharynx is clear. Neck is nontender and supple without adenopathy or JVD. Back is nontender and there is no CVA tenderness. Lungs are clear without rales, wheezes, or rhonchi. Chest is nontender. Heart has regular rate and rhythm without murmur. Abdomen is soft, flat, nontender without  masses or hepatosplenomegaly and peristalsis is normoactive. Extremities have 1+ edema, full range of motion is present. Skin is warm and dry without rash. Neurologic: Mental status is normal, cranial nerves are intact, there are no motor or sensory deficits.  ED Treatments / Results  Labs (all labs ordered are listed, but only abnormal results are displayed) Labs Reviewed  BASIC METABOLIC PANEL - Abnormal; Notable for the following components:      Result Value   Potassium 3.3 (*)    Glucose, Bld 116 (*)    All other components within normal limits  CBC - Abnormal; Notable for the following components:   Hemoglobin 11.6  (*)    RDW 16.7 (*)    All other components within normal limits  TROPONIN I - Abnormal; Notable for the following components:   Troponin I 1.52 (*)    All other components within normal limits    EKG  EKG Interpretation  Date/Time:  Monday December 03 2017 00:36:32 EST Ventricular Rate:  72 PR Interval:  152 QRS Duration: 88 QT Interval:  428 QTC Calculation: 468 R Axis:   6 Text Interpretation:  Normal sinus rhythm Normal ECG No old tracing to compare Confirmed by Dione Booze ([id number]) on 12/03/2017 12:48:46 AM       Radiology Dg Chest 2 View  Result Date: 12/03/2017 CLINICAL DATA:  Chest pain, onset yesterday. EXAM: CHEST  2 VIEW COMPARISON:  12/28/2004 FINDINGS: The cardiomediastinal contours are normal. Subsegmental left lung base atelectasis or scarring. Pulmonary vasculature is normal. No consolidation, pleural effusion, or pneumothorax. No acute osseous abnormalities are seen. IMPRESSION: No acute abnormality. Electronically Signed   By: Rubye Oaks M.D.   On: 12/03/2017 01:38    Procedures Procedures (including critical care time) CRITICAL CARE Performed by: Dione Booze Total critical care time: 40 minutes Critical care time was exclusive of separately billable procedures and treating other patients. Critical care was necessary to treat or prevent imminent or life-threatening deterioration. Critical care was time spent personally by me on the following activities: development of treatment plan with patient and/or surrogate as well as nursing, discussions with consultants, evaluation of patient's response to treatment, examination of patient, obtaining history from patient or surrogate, ordering and performing treatments and interventions, ordering and review of laboratory studies, ordering and review of radiographic studies, pulse oximetry and re-evaluation of patient's condition.  Medications Ordered in ED Medications - No data to display   Initial Impression /  Assessment and Plan / ED Course  I have reviewed the triage vital signs and the nursing notes.  Pertinent labs & imaging results that were available during my care of the patient were reviewed by me and considered in my medical decision making (see chart for details).  Chest pain.  ECG shows no acute process, but troponin has come back elevated indicating her chest pain is a NSTEMI.  She is given aspirin and heparin and is given morphine for pain.  She is noted to have mild hypokalemia and is given oral potassium.  Old records are reviewed, and she has no relevant past visits.  Case is discussed with Dr. Santiago Glad of cardiology service at [facility] who agrees to accept patient in transfer.  Final Clinical Impressions(s) / ED Diagnoses   Final diagnoses:  NSTEMI (non-ST elevated myocardial infarction) (HCC)  Hypokalemia  Normochromic normocytic anemia    ED Discharge Orders    None       Dione Booze, MD 12/03/17 [phone]

## 2017-12-03 NOTE — Interval H&P Note (Signed)
Cath Lab Visit (complete for each Cath Lab visit)  Clinical Evaluation Leading to the Procedure:   ACS: Yes.    Non-ACS:    Anginal Classification: CCS III  Anti-ischemic medical therapy: No Therapy  Non-Invasive Test Results: No non-invasive testing performed  Prior CABG: No previous CABG      History and Physical Interval Note:  12/03/2017 1:29 PM  Claudia Lewis  has presented today for surgery, with the diagnosis of n stemi  The various methods of treatment have been discussed with the patient and family. After consideration of risks, benefits and other options for treatment, the patient has consented to  Procedure(s): LEFT HEART CATH AND CORONARY ANGIOGRAPHY (N/A) as a surgical intervention .  The patient's history has been reviewed, patient examined, no change in status, stable for surgery.  I have reviewed the patient's chart and labs.  Questions were answered to the patient's satisfaction.     Nanetta Batty

## 2017-12-03 NOTE — ED Triage Notes (Signed)
Pt c/o chest pain that started yesterday morning while sitting in church; pt states the pain is in the center of her chest and radiates around to her back

## 2017-12-03 NOTE — Progress Notes (Signed)
Right radial cath site level 0.Patient no complaints of any pain or discomfort.

## 2017-12-03 NOTE — ED Provider Notes (Signed)
8:35 AM complains of minimal left sided parathoracic back pain.  Resting comfortably.     Doug Sou, MD 12/03/17 [phone]

## 2017-12-03 NOTE — Progress Notes (Signed)
Cards PA made aware of Trop 1.40.

## 2017-12-03 NOTE — Progress Notes (Signed)
ANTICOAGULATION CONSULT NOTE - Initial Consult  Pharmacy Consult for Heparin Pt is a 72 yo female admitted for intermittant chest pain which began yesterday morning. Pain is midsternum radiating to the back.  Elevated troponin indicates NSTEMI.  She also presents with mild hypokalemia.  No Known Allergies  Patient Measurements: Height: 5\' 6"  (167.6 cm) Weight: 190 lb (86.2 kg) IBW/kg (Calculated) : 59.3 Heparin Dosing Weight:=77.68 kg  Vital Signs: Temp: 97.9 F (36.6 C) (01/07 0041) Temp Source: Oral (01/07 0041) BP: 164/84 (01/07 0400) Pulse Rate: 57 (01/07 0400)  Labs: Recent Labs    12/03/17 0131  HGB 11.6*  HCT 37.5  PLT 317  LABPROT 12.7  INR 0.96  CREATININE 0.89  TROPONINI 1.52*    Estimated Creatinine Clearance: 66 mL/min (by C-G formula based on SCr of 0.89 mg/dL).   Medical History: Past Medical History:  Diagnosis Date  . Arthritis     Medications:  Aspirin 324mg  po x1  Assessment: NSTEMI  Goal of Therapy: Heparin level=0.3-0.6  Monitor platelets by anticoagulation protocol: Yes    Plan:  Heparin 4000 unit bolus Heparin drip 12 units/kg/hour Baseline PTT, PT/INR Follow up 6 hour Heparin level Check daily Heparin level & CBC if continued  [facility] pharmacist will review during morning rounds and adjust as appropriate.  Hovey-Rankin, Vasilios Ottaway 12/03/2017,4:27 AM

## 2017-12-03 NOTE — Discharge Summary (Signed)
Discharge Summary    Patient ID: Claudia Lewis,  MRN: [id number], DOB/AGE: 70-Mar-1947 70 y.o.  Admit date: 12/03/2017 Discharge date: 12/03/2017  Primary Care Provider: Patient, No Pcp Per Primary Cardiologist: Nona Dell, MD - New  Discharge Diagnoses    Active Problems:   NSTEMI (non-ST elevated myocardial infarction) [facility])   Allergies No Known Allergies  Diagnostic Studies/Procedures    LEFT HEART CATH AND CORONARY ANGIOGRAPHY  12/03/2017  Conclusion    The left ventricular systolic function is normal.  LV end diastolic pressure is normal.  The left ventricular ejection fraction is 55-65% by visual estimate.    IMPRESSION: Claudia Lewis has essentially normal coronary arteries and normal LV function. Her arteries are somewhat tortuous system with hypertensive heart disease." Lesions. Medical therapy will be recommended. The sheath was removed and a TR band was placed on the right wrist which is patent hemostasis. The patient left the lab in stable condition. She can be discharged later today and followed up closely as an outpatient _____________   History of Present Illness     Claudia Lewis is a 72 y.o. female with past medical history of Hypothyroidism, HLD (diet-controlled), and prior tobacco use who presented to [facility] for evaluation of chest pain. She reported being in her usual state of health until the previous morning when she developed substernal chest discomfort. She describes this as a tightness which radiated across her entire pectoral region. She initially thought the discomfort was secondary to reflux and took Tums with no improvement in her symptoms. The pain persisted throughout the day, therefore she came to the emergency department for further evaluation. She denies any associated dyspnea, diaphoresis, or vomiting. Did have mild nausea. Pain significantly improved with administration of ASA and sublingual nitroglycerin upon arrival to the  ED.  She denies any prior history of CAD. No known HTN or Type 2 DM.  No known family history of CAD. She is a prior smoker but quit in 1990.  No prior alcohol use or recreational drug use.  She was transferred to [facility] for cardiac cath.  Hospital Course     Consultants: None  Initial labs in the ED show WBC 5.1, Hgb 11.6, and platelets 317. Na+ 138, K+ 3.3, and creatinine 0.89. Initial troponin 1.52 with repeat of 1.67 and 1.40. CXR with no acute cardiopulmonary abnormalities. EKG shows NSR, HR 72, with no acute ST or T-wave changes.   Cardiac cath revealed essentially normal coronary arteries and normal LV function. Her arteries are somewhat tortuous system with hypertensive heart disease.  Medical therapy will be recommended. She will be discharged today with close follow up.   She tolerated the cath well and will be discharged home. Right radial cath site is without hematoma.  Hyperlipidemia diet controlled by pt report. LDL 59 in 01/2017. With no CAD by cath no need for statin at this time.  Patient has been seen by Dr. Allyson Sabal today and deemed ready for discharge home. All follow up appointments have been scheduled. Discharge medications are listed below.  _____________  Discharge Vitals Blood pressure 126/69, pulse 83, temperature 97.9 F (36.6 C), temperature source Oral, resp. rate 16, height 5\' 6"  (1.676 m), weight 193 lb 3.2 oz (87.6 kg), SpO2 94 %.  Filed Weights   12/03/17 0042 12/03/17 1454  Weight: 190 lb (86.2 kg) 193 lb 3.2 oz (87.6 kg)    Labs & Radiologic Studies    CBC Recent Labs  12/03/17 0131  WBC 5.1  HGB 11.6*  HCT 37.5  MCV 85.0  PLT 317   Basic Metabolic Panel Recent Labs    16/38/46 0131  NA 138  K 3.3*  CL 102  CO2 25  GLUCOSE 116*  BUN 11  CREATININE 0.89  CALCIUM 9.8   Liver Function Tests No results for input(s): AST, ALT, ALKPHOS, BILITOT, PROT, ALBUMIN in the last 72 hours. No results for input(s): LIPASE,  AMYLASE in the last 72 hours. Cardiac Enzymes Recent Labs    12/03/17 0131 12/03/17 0820 12/03/17 1533  TROPONINI 1.52* 1.67* 1.40*   BNP Invalid input(s): POCBNP D-Dimer No results for input(s): DDIMER in the last 72 hours. Hemoglobin A1C No results for input(s): HGBA1C in the last 72 hours. Fasting Lipid Panel No results for input(s): CHOL, HDL, LDLCALC, TRIG, CHOLHDL, LDLDIRECT in the last 72 hours. Thyroid Function Tests No results for input(s): TSH, T4TOTAL, T3FREE, THYROIDAB in the last 72 hours.  Invalid input(s): FREET3 _____________  Dg Chest 2 View  Result Date: 12/03/2017 CLINICAL DATA:  Chest pain, onset yesterday. EXAM: CHEST  2 VIEW COMPARISON:  12/28/2004 FINDINGS: The cardiomediastinal contours are normal. Subsegmental left lung base atelectasis or scarring. Pulmonary vasculature is normal. No consolidation, pleural effusion, or pneumothorax. No acute osseous abnormalities are seen. IMPRESSION: No acute abnormality. Electronically Signed   By: Rubye Oaks M.D.   On: 12/03/2017 01:38   Disposition   Pt is being discharged home today in good condition.  Follow-up Plans & Appointments    Follow-up Information    Jonelle Sidle, MD Follow up.   Specialty:  Cardiology Why:  The office will call you to arrange cardiolgy hospital follow up for about 2 weeks.  Contact information: [address] [place] [address] [phone]          Discharge Instructions    Diet - low sodium heart healthy   Complete by:  As directed    Discharge instructions   Complete by:  As directed    PLEASE REMEMBER TO BRING ALL OF YOUR MEDICATIONS TO EACH OF YOUR FOLLOW-UP OFFICE VISITS.  PLEASE ATTEND ALL SCHEDULED FOLLOW-UP APPOINTMENTS.   Activity: Increase activity slowly as tolerated. You may shower, but no soaking baths (or swimming) for 1 week. No driving for 24 hours. No lifting over 5 lbs for 1 week. No sexual activity for 1 week.   You May Return to  Work: in 1 week (if applicable)  Wound Care: You may wash cath site gently with soap and water. Keep cath site clean and dry. If you notice pain, swelling, bleeding or pus at your cath site, please call [phone].   Increase activity slowly   Complete by:  As directed       Discharge Medications   Allergies as of 12/03/2017   No Known Allergies     Medication List    TAKE these medications   folic acid 1 MG tablet Commonly known as:  FOLVITE Take 1 tablet by mouth daily.   levothyroxine 25 MCG tablet Commonly known as:  SYNTHROID, LEVOTHROID Take 25 mcg by mouth daily.   methotrexate 2.5 MG tablet Commonly known as:  RHEUMATREX Take 15 mg by mouth once a week. Take on Sunday. Caution:Chemotherapy. Protect from light.   metoprolol tartrate 25 MG tablet Commonly known as:  LOPRESSOR Take 0.5 tablets (12.5 mg total) by mouth 2 (two) times daily.   pantoprazole 20 MG tablet Commonly known as:  PROTONIX Take 1  tablet by mouth daily.         Outstanding Labs/Studies   None  Duration of Discharge Encounter   Greater than 30 minutes including physician time.  Signed, Berton Bon NP 12/03/2017, 6:34 PM

## 2017-12-04 ENCOUNTER — Encounter (HOSPITAL_COMMUNITY): Payer: Self-pay | Admitting: Cardiovascular Disease

## 2017-12-17 ENCOUNTER — Encounter: Payer: Self-pay | Admitting: Physician Assistant

## 2017-12-17 NOTE — Progress Notes (Addendum)
Cardiology Office Note    Date:  12/19/2017  ID:  Ladoris, Lythgoe 01-13-1946, MRN [id number] PCP:  Jeanice Lim, PA-C  Cardiologist:  Dr. Diona Browner  Chief Complaint: f/u MI  History of Present Illness:  Claudia Lewis is a 72 y.o. female with history of hypothyroidism, HLD (diet-controlled), prior tobacco use, arthritis on methotrexate, recent NSTEMI without significant coronary disease who presents for post-hospital follow-up. She was recently admitted with chest pain and elevated troponin with peak of 1.67. No recreational drug use reported. Symptoms improved significantly with ASA and SL NTG. Cath showed normal coronary arteries and normal LV function - they were somewhat tortuous consistent with hypertensive heart disease. No specific therapy was recommended. Labs revealed Hgb 11.6, K 3.3, Cr 0.89. Per pt report recent LDL in 01/2017 was 59.  She returns for follow-up with her husband today. They did not pick up the metoprolol because they didn't think she needed if her heart arteries were fine. She has been taking a baby aspirin every day. She has had rare fleeting chest tightness episodes (2 since discharge) but these were brief and much less severe than recent NSTEMI. She feels well today without chest pain or dyspnea. She has chronic soft mild ankle edema which she states is at baseline.   Past Medical History:  Diagnosis Date  . Anemia    a. noted on 11/2017 labs.  . Arthritis   . Hyperlipidemia   . Hypokalemia   . Hypothyroidism   . NSTEMI (non-ST elevated myocardial infarction) (HCC)    a. 11/2017 - cath with normal, tortuous arteries, no culprit, EF normal.    Past Surgical History:  Procedure Laterality Date  . ABDOMINAL HYSTERECTOMY    . LEFT HEART CATH AND CORONARY ANGIOGRAPHY N/A 12/03/2017   Procedure: LEFT HEART CATH AND CORONARY ANGIOGRAPHY;  Surgeon: Runell Gess, MD;  Location: [facility] INVASIVE CV LAB;  Service: Cardiovascular;  Laterality: N/A;     Current Medications: Current Meds  Medication Sig  . folic acid (FOLVITE) 1 MG tablet Take 1 tablet by mouth daily.  Marland Kitchen levothyroxine (SYNTHROID, LEVOTHROID) 25 MCG tablet Take 25 mcg by mouth daily.  . methotrexate (RHEUMATREX) 2.5 MG tablet Take 15 mg by mouth once a week. Take on Sunday. Caution:Chemotherapy. Protect from light.  . pantoprazole (PROTONIX) 20 MG tablet Take 1 tablet by mouth daily.    Allergies:   Patient has no known allergies.   Social History   Socioeconomic History  . Marital status: Married    Spouse name: None  . Number of children: None  . Years of education: None  . Highest education level: None  Social Needs  . Financial resource strain: None  . Food insecurity - worry: None  . Food insecurity - inability: None  . Transportation needs - medical: None  . Transportation needs - non-medical: None  Occupational History  . None  Tobacco Use  . Smoking status: Former Smoker    Last attempt to quit: 12/03/1988    Years since quitting: 29.0  . Smokeless tobacco: Never Used  Substance and Sexual Activity  . Alcohol use: No  . Drug use: No  . Sexual activity: None  Other Topics Concern  . None  Social History Narrative  . None     Family History:  Family History  Problem Relation Age of Onset  . Hypertension Mother     ROS:   Please see the history of present illness. All other systems are reviewed  and otherwise negative.    PHYSICAL EXAM:   VS:  BP 124/74 (BP Location: Left Arm)   Pulse 86   Ht 5\' 6"  (1.676 m)   Wt 196 lb (88.9 kg)   SpO2 94%   BMI 31.64 kg/m   BMI: Body mass index is 31.64 kg/m. GEN: Well nourished, well developed obese AAF, in no acute distress  HEENT: normocephalic, atraumatic Neck: no JVD, carotid bruits, or masses Cardiac: RRR; no murmurs, rubs, or gallops, no edema  Respiratory:  clear to auscultation bilaterally, normal work of breathing GI: soft, nontender, nondistended, + BS MS: no deformity or atrophy   Skin: warm and dry, no rash, right radial cath site without hematoma or ecchymosis; good pulse. Neuro:  Alert and Oriented x 3, Strength and sensation are intact, follows commands Psych: euthymic mood, full affect  Wt Readings from Last 3 Encounters:  12/19/17 196 lb (88.9 kg)  12/03/17 193 lb 3.2 oz (87.6 kg)  04/23/15 212 lb (96.2 kg)      Studies/Labs Reviewed:   EKG: EKG was not ordered today.  Recent Labs: 12/03/2017: BUN 11; Creatinine, Ser 0.89; Hemoglobin 11.6; Platelets 317; Potassium 3.3; Sodium 138   Lipid Panel No results found for: CHOL, TRIG, HDL, CHOLHDL, VLDL, LDLCALC, LDLDIRECT  Additional studies/ records that were reviewed today include: Summarized above.    ASSESSMENT & PLAN:   1. NSTEMI - based on symptoms, troponin and negative cath, I question whether she had an episode of coronary vasospasm. She reports a long history of intermittent chest pains similar to what prompted her admission, but in the past this was relieved with cold water. Will continue aspirin 81mg  daily. I reviewed the possible use of DAPT with Dr. 01/31/2018 (DOD) today in clinic and he feels there is limited role for addition of Plavix given her normal coronaries; I will send to Dr. for his input as well. (Addendum: Dr. Purvis Sheffield agrees the data is lacking for this subgroup, therefore would recommend aspirin 81mg  daily.) I do think an antispasm agent would be helpful. Given her mild chronic edema, have selected low dose Imdur 15mg  daily to start (she is wary of having headaches as she normally does not get them, but is agreeable to a trial of this). If she does not tolerate this, would change her to amlodipine 2.5mg  daily. I agree she can hold off on metoprolol for now. Note she had had some pain even while in the ED while in NSR so symptoms do not appear to be related to any arrhythmias. 2. Hyperlipidemia - followed by PCP per patient. 3. Hypothyroidism - check TSH given recent  MI. 4. Hypokalemia - recheck today including magnesium level.  Disposition: F/u with Dr. Diona Browner in 3 months. I also advised she f/u with PCP for chronic monitoring of her anemia (denies any bleeding).   Medication Adjustments/Labs and Tests Ordered: Current medicines are reviewed at length with the patient today.  Concerns regarding medicines are outlined above. Medication changes, Labs and Tests ordered today are summarized above and listed in the Patient Instructions accessible in Encounters.   Signed, Diona Browner, PA-C  12/19/2017 2:47 PM    [facility] [place] Location in [facility] [address] [place], [facility] 12-03-1982 Ph: [phone]; Fax [phone]

## 2017-12-19 ENCOUNTER — Encounter: Payer: Self-pay | Admitting: Physician Assistant

## 2017-12-19 ENCOUNTER — Other Ambulatory Visit (HOSPITAL_COMMUNITY)
Admission: RE | Admit: 2017-12-19 | Discharge: 2017-12-19 | Disposition: A | Discharge status: Home / Self Care | Payer: Medicare HMO | Source: Ambulatory Visit | Attending: Physician Assistant | Admitting: Physician Assistant

## 2017-12-19 ENCOUNTER — Ambulatory Visit: Payer: Medicare HMO | Admitting: Physician Assistant

## 2017-12-19 VITALS — BP 124/74 | HR 86 | Ht 66.0 in | Wt 196.0 lb

## 2017-12-19 DIAGNOSIS — E876 Hypokalemia: Secondary | ICD-10-CM | POA: Diagnosis not present

## 2017-12-19 DIAGNOSIS — E785 Hyperlipidemia, unspecified: Secondary | ICD-10-CM | POA: Diagnosis not present

## 2017-12-19 DIAGNOSIS — I214 Non-ST elevation (NSTEMI) myocardial infarction: Secondary | ICD-10-CM

## 2017-12-19 DIAGNOSIS — E039 Hypothyroidism, unspecified: Secondary | ICD-10-CM

## 2017-12-19 LAB — BASIC METABOLIC PANEL
Anion gap: 11 (ref 5–15)
BUN: 13 mg/dL (ref 6–20)
CALCIUM: 9.2 mg/dL (ref 8.9–10.3)
CO2: 24 mmol/L (ref 22–32)
CREATININE: 0.85 mg/dL (ref 0.44–1.00)
Chloride: 104 mmol/L (ref 101–111)
GFR calc non Af Amer: >60 mL/min (ref >60)
GLUCOSE: 101 mg/dL — AB (ref 65–99)
Potassium: 3.7 mmol/L (ref 3.5–5.1)
Sodium: 139 mmol/L (ref 135–145)

## 2017-12-19 LAB — TSH: TSH: 6.167 u[IU]/mL — ABNORMAL HIGH (ref 0.350–4.500)

## 2017-12-19 LAB — MAGNESIUM: Magnesium: 1.9 mg/dL (ref 1.7–2.4)

## 2017-12-19 MED ORDER — ISOSORBIDE MONONITRATE ER 30 MG PO TB24: 15.0000 mg | ORAL_TABLET | Freq: Every day | ORAL | 3 refills | Status: DC

## 2017-12-19 NOTE — Patient Instructions (Signed)
Medication Instructions:  CONTINUE ASPIRIN 81 MG DAILY START IMDUR 15 MG DAILY (1/2 TABLET)  DO NOT START THE METOPROLOL Labwork: TODAY  TSH  MAGNESIUM  BMET  Testing/Procedures: NONE  Follow-Up: Your physician recommends that you schedule a follow-up appointment in: 3 MONTHS    Any Other Special Instructions Will Be Listed Below (If Applicable).     If you need a refill on your cardiac medications before your next appointment, please call your pharmacy.

## 2018-02-13 DIAGNOSIS — Z860101 Personal history of adenomatous and serrated colon polyps: Secondary | ICD-10-CM | POA: Insufficient documentation

## 2018-02-18 ENCOUNTER — Other Ambulatory Visit: Payer: Self-pay | Admitting: Physician Assistant

## 2018-02-18 MED ORDER — ISOSORBIDE MONONITRATE ER 30 MG PO TB24: 15.0000 mg | ORAL_TABLET | Freq: Every day | ORAL | 3 refills | Status: DC

## 2018-02-18 NOTE — Telephone Encounter (Signed)
Pharmacy changed to [facility]

## 2018-02-18 NOTE — Telephone Encounter (Signed)
Please send Imdur RX to [facility] in [place] / tg

## 2018-03-25 NOTE — Progress Notes (Signed)
Cardiology Office Note  Date: 03/26/2018   ID: Susana, Duell 1946/09/23, MRN [id number]  PCP: Jeanice Lim, PA-C  Primary Cardiologist: Nona Dell, MD   Chief Complaint  Patient presents with  . Cardiac follow-up    History of Present Illness: Claudia Lewis is a 72 y.o. female last seen by Ms. Dunn PA-C in January.  She presents for a routine follow-up visit.  Reports no chest pain or unusual shortness of breath in the interim.  No palpitations or syncope.  Cardiac history includes NSTEMI with peak troponin I of 1.67 back in January of this year.  Cardiac catheterization at that time however revealed somewhat tortuous coronary arteries but no significant stenosis with recommendation for medical therapy.  Whether this event represented transient plaque rupture or possibly vasospasm is unclear.  She did not have clear evidence of stress-induced cardiomyopathy based on ventriculogram.  She had no arrhythmias at that time.  I reviewed her medications.  She continues on aspirin and Imdur.  Past Medical History:  Diagnosis Date  . Anemia    a. noted on 11/2017 labs.  . Arthritis   . Hyperlipidemia   . Hypokalemia   . Hypothyroidism   . NSTEMI (non-ST elevated myocardial infarction) (HCC)    a. 11/2017 - cath with normal, tortuous arteries, no culprit, EF normal.    Past Surgical History:  Procedure Laterality Date  . ABDOMINAL HYSTERECTOMY    . LEFT HEART CATH AND CORONARY ANGIOGRAPHY N/A 12/03/2017   Procedure: LEFT HEART CATH AND CORONARY ANGIOGRAPHY;  Surgeon: Runell Gess, MD;  Location: [facility];  Service: Cardiovascular;  Laterality: N/A;    Current Outpatient Medications  Medication Sig Dispense Refill  . aspirin EC 81 MG tablet Take 81 mg by mouth daily.    . folic acid (FOLVITE) 1 MG tablet Take 1 tablet by mouth daily.  11  . isosorbide mononitrate (IMDUR) 30 MG 24 hr tablet Take 0.5 tablets (15 mg total) by mouth daily. 45 tablet 3   . levothyroxine (SYNTHROID, LEVOTHROID) 25 MCG tablet Take 25 mcg by mouth daily.  2  . methotrexate (RHEUMATREX) 2.5 MG tablet Take 15 mg by mouth once a week. Take on Sunday. Caution:Chemotherapy. Protect from light.    . pantoprazole (PROTONIX) 20 MG tablet Take 1 tablet by mouth daily.  1   No current facility-administered medications for this visit.    Allergies:  Patient has no known allergies.   Social History: The patient  reports that she quit smoking about 29 years ago. She has never used smokeless tobacco. She reports that she does not drink alcohol or use drugs.   ROS:  Please see the history of present illness. Otherwise, complete review of systems is positive for none.  All other systems are reviewed and negative.   Physical Exam: VS:  BP (!) 144/78 (BP Location: Right Arm)   Pulse 74   Ht 5' 6.5" (1.689 m)   Wt 196 lb (88.9 kg)   SpO2 91%   BMI 31.16 kg/m , BMI Body mass index is 31.16 kg/m.  Wt Readings from Last 3 Encounters:  03/26/18 196 lb (88.9 kg)  12/19/17 196 lb (88.9 kg)  12/03/17 193 lb 3.2 oz (87.6 kg)    General: Patient appears comfortable at rest. HEENT: Conjunctiva and lids normal, oropharynx clear. Neck: Supple, no elevated JVP or carotid bruits, no thyromegaly. Lungs: Clear to auscultation, nonlabored breathing at rest. Cardiac: Regular rate and rhythm, no  S3 or significant systolic murmur. Abdomen: Soft, nontender, bowel sounds present, no guarding or rebound. Extremities: No pitting edema, distal pulses 2+. Skin: Warm and dry. Musculoskeletal: No kyphosis. Neuropsychiatric: Alert and oriented x3, affect grossly appropriate.  ECG: I personally reviewed the tracing from 12/03/2017 which showed normal sinus rhythm.  Recent Labwork: 12/03/2017: Hemoglobin 11.6; Platelets 317 12/19/2017: BUN 13; Creatinine, Ser 0.85; Magnesium 1.9; Potassium 3.7; Sodium 139; TSH 6.167   Other Studies Reviewed Today:  Cardiac catheterization 12/03/2017:  The  left ventricular systolic function is normal.  LV end diastolic pressure is normal.  The left ventricular ejection fraction is 55-65% by visual estimate.  IMPRESSION: Ms. Diloreto has essentially normal coronary arteries and normal LV function. Her arteries are somewhat tortuous system with hypertensive heart disease." Lesions. Medical therapy will be recommended. The sheath was removed and a TR band was placed on the right wrist which is patent hemostasis. The patient left the lab in stable condition. She can be discharged later today and followed up closely as an outpatient  Assessment and Plan:  1.  History of NSTEMI in January in the setting of somewhat tortuous coronary arteries but no significant obstructive disease.  Is possible that she had coronary vasospasm.  No clear evidence of stress-induced cardiomyopathy or arrhythmia.  She is symptom-free at this point.  Would continue with aspirin and Imdur for now.  2.  History of hyperlipidemia, diet managed by PCP.  Current medicines were reviewed with the patient today.  Disposition: Follow-up in 1 year, sooner if needed.  Signed, Jonelle Sidle, MD, [facility] 03/26/2018 2:39 PM    [facility] at [facility] [address], [place], [place] [address] Phone: [phone]; Fax: [phone]

## 2018-03-26 ENCOUNTER — Encounter: Payer: Self-pay | Admitting: Cardiology

## 2018-03-26 ENCOUNTER — Ambulatory Visit: Payer: Medicare HMO | Admitting: Cardiology

## 2018-03-26 VITALS — BP 144/78 | HR 74 | Ht 66.5 in | Wt 196.0 lb

## 2018-03-26 DIAGNOSIS — I252 Old myocardial infarction: Secondary | ICD-10-CM

## 2018-03-26 DIAGNOSIS — E782 Mixed hyperlipidemia: Secondary | ICD-10-CM | POA: Diagnosis not present

## 2018-03-26 NOTE — Patient Instructions (Signed)
Your physician wants you to follow-up in: 1 year with Dr.McDowell You will receive a reminder letter in the mail two months in advance. If you don't receive a letter, please call our office to schedule the follow-up appointment.    Your physician recommends that you continue on your current medications as directed. Please refer to the Current Medication list given to you today.    If you need a refill on your cardiac medications before your next appointment, please call your pharmacy.     No lab work or tests ordered today.      Thank you for choosing [facility] !        

## 2018-05-24 DIAGNOSIS — M48061 Spinal stenosis, lumbar region without neurogenic claudication: Secondary | ICD-10-CM | POA: Insufficient documentation

## 2018-07-03 ENCOUNTER — Emergency Department (HOSPITAL_COMMUNITY)
Admission: EM | Admit: 2018-07-03 | Discharge: 2018-07-03 | Disposition: A | Discharge status: Home / Self Care | Payer: Medicare HMO | Attending: Emergency Medicine | Admitting: Emergency Medicine

## 2018-07-03 ENCOUNTER — Encounter (HOSPITAL_COMMUNITY): Payer: Self-pay | Admitting: Emergency Medicine

## 2018-07-03 ENCOUNTER — Emergency Department (HOSPITAL_COMMUNITY): Payer: Medicare HMO

## 2018-07-03 ENCOUNTER — Other Ambulatory Visit: Payer: Self-pay

## 2018-07-03 DIAGNOSIS — Z87891 Personal history of nicotine dependence: Secondary | ICD-10-CM | POA: Insufficient documentation

## 2018-07-03 DIAGNOSIS — R42 Dizziness and giddiness: Secondary | ICD-10-CM | POA: Diagnosis present

## 2018-07-03 DIAGNOSIS — E039 Hypothyroidism, unspecified: Secondary | ICD-10-CM | POA: Diagnosis not present

## 2018-07-03 DIAGNOSIS — I252 Old myocardial infarction: Secondary | ICD-10-CM | POA: Insufficient documentation

## 2018-07-03 LAB — CBC WITH DIFFERENTIAL/PLATELET
BASOS PCT: 0 %
Basophils Absolute: 0 10*3/uL (ref 0.0–0.1)
EOS ABS: 0.1 10*3/uL (ref 0.0–0.7)
Eosinophils Relative: 2 %
HEMATOCRIT: 37.5 % (ref 36.0–46.0)
HEMOGLOBIN: 12 g/dL (ref 12.0–15.0)
LYMPHS ABS: 1.7 10*3/uL (ref 0.7–4.0)
Lymphocytes Relative: 33 %
MCH: 27.7 pg (ref 26.0–34.0)
MCHC: 32 g/dL (ref 30.0–36.0)
MCV: 86.6 fL (ref 78.0–100.0)
MONOS PCT: 5 %
Monocytes Absolute: 0.2 10*3/uL (ref 0.1–1.0)
NEUTROS ABS: 3 10*3/uL (ref 1.7–7.7)
NEUTROS PCT: 60 %
Platelets: 342 10*3/uL (ref 150–400)
RBC: 4.33 MIL/uL (ref 3.87–5.11)
RDW: 16.4 % — ABNORMAL HIGH (ref 11.5–15.5)
WBC: 5 10*3/uL (ref 4.0–10.5)

## 2018-07-03 LAB — I-STAT CHEM 8, ED
BUN: 11 mg/dL (ref 8–23)
CALCIUM ION: 1.14 mmol/L — AB (ref 1.15–1.40)
CREATININE: 0.7 mg/dL (ref 0.44–1.00)
Chloride: 104 mmol/L (ref 98–111)
GLUCOSE: 103 mg/dL — AB (ref 70–99)
HCT: 37 % (ref 36.0–46.0)
HEMOGLOBIN: 12.6 g/dL (ref 12.0–15.0)
Potassium: 3.6 mmol/L (ref 3.5–5.1)
Sodium: 142 mmol/L (ref 135–145)
TCO2: 23 mmol/L (ref 22–32)

## 2018-07-03 LAB — COMPREHENSIVE METABOLIC PANEL
ALK PHOS: 71 U/L (ref 38–126)
ALT: 27 U/L (ref 0–44)
ANION GAP: 8 (ref 5–15)
AST: 23 U/L (ref 15–41)
Albumin: 4.2 g/dL (ref 3.5–5.0)
BILIRUBIN TOTAL: 0.6 mg/dL (ref 0.3–1.2)
BUN: 13 mg/dL (ref 8–23)
CALCIUM: 9.1 mg/dL (ref 8.9–10.3)
CO2: 25 mmol/L (ref 22–32)
CREATININE: 0.94 mg/dL (ref 0.44–1.00)
Chloride: 107 mmol/L (ref 98–111)
GFR calc Af Amer: >60 mL/min (ref >60)
GFR calc non Af Amer: >60 mL/min (ref >60)
Glucose, Bld: 107 mg/dL — ABNORMAL HIGH (ref 70–99)
Potassium: 4 mmol/L (ref 3.5–5.1)
Sodium: 140 mmol/L (ref 135–145)
TOTAL PROTEIN: 8.2 g/dL — AB (ref 6.5–8.1)

## 2018-07-03 MED ORDER — LACTATED RINGERS IV BOLUS
1000.0000 mL | Freq: Once | INTRAVENOUS | Status: AC
  Administered 2018-07-03: 1000 mL via INTRAVENOUS

## 2018-07-03 MED ORDER — MECLIZINE HCL 12.5 MG PO TABS
25.0000 mg | ORAL_TABLET | Freq: Once | ORAL | Status: AC
  Administered 2018-07-03: 25 mg via ORAL
  Filled 2018-07-03: qty 2

## 2018-07-03 MED ORDER — IOPAMIDOL (ISOVUE-370) INJECTION 76%
100.0000 mL | Freq: Once | INTRAVENOUS | Status: AC | PRN
  Administered 2018-07-03: 100 mL via INTRAVENOUS

## 2018-07-03 NOTE — ED Notes (Signed)
Patient transported to CT 

## 2018-07-03 NOTE — ED Triage Notes (Signed)
PT states she woke up x3 days ago with intermittent dizziness mainly in the mornings with nausea. PT states her nausea has worsened today with 2 episodes of vomiting.

## 2018-07-03 NOTE — ED Notes (Signed)
ED Provider at bedside. 

## 2018-07-03 NOTE — ED Provider Notes (Signed)
Emergency Department Provider Note   I have reviewed the triage vital signs and the nursing notes.   HISTORY  Chief Complaint Dizziness   HPI Claudia Lewis is a 72 y.o. female with medical problems as documented below the presents the emerge department today secondary to vertigo.  Patient states that she felt a snap in her neck when she turned her head to the right and has had persistent pain in the left posterior neck ever since Sunday.  She started having vertigo with this as well and has been progressively worsening ever since then.  She has no history of vertigo.  She talk to her doctor and they called her in some meclizine however she never got picked up to try it she wanted Darrick Huntsman was normal so she comes here.  She has no numbness, weakness or tingling anywhere.  No passing out.  No vision changes. No other associated or modifying symptoms.    Past Medical History:  Diagnosis Date  . Anemia    a. noted on 11/2017 labs.  . Arthritis   . Hyperlipidemia   . Hypokalemia   . Hypothyroidism   . NSTEMI (non-ST elevated myocardial infarction) (HCC)    a. 11/2017 - cath with normal, tortuous arteries, no culprit, EF normal.    Patient Active Problem List   Diagnosis Date Noted  . NSTEMI (non-ST elevated myocardial infarction) (HCC) 12/03/2017    Past Surgical History:  Procedure Laterality Date  . ABDOMINAL HYSTERECTOMY    . LEFT HEART CATH AND CORONARY ANGIOGRAPHY N/A 12/03/2017   Procedure: LEFT HEART CATH AND CORONARY ANGIOGRAPHY;  Surgeon: Runell Gess, MD;  Location: [facility];  Service: Cardiovascular;  Laterality: N/A;    Current Outpatient Rx  . Order #: [id number] Class: Historical Med  . Order #: [id number] Class: Historical Med  . Order #: [id number] Class: Historical Med  . Order #: [id number] Class: Historical Med  . Order #: [id number] Class: Normal  . Order #: [id number] Class: Historical Med  . Order #: [id number] Class: Historical Med  . Order #:  [id number] Class: Historical Med    Allergies Patient has no known allergies.  Family History  Problem Relation Age of Onset  . Hypertension Mother     Social History Social History   Tobacco Use  . Smoking status: Former Smoker    Last attempt to quit: 12/03/1988    Years since quitting: 29.6  . Smokeless tobacco: Never Used  Substance Use Topics  . Alcohol use: No  . Drug use: No    Review of Systems  All other systems negative except as documented in the HPI. All pertinent positives and negatives as reviewed in the HPI. ____________________________________________   PHYSICAL EXAM:  VITAL SIGNS: ED Triage Vitals [07/03/18 1836]  Enc Vitals Group     BP (!) 159/80     Pulse Rate 76     Resp 18     Temp 97.8 F (36.6 C)     Temp Source Oral     SpO2 96 %     Weight 197 lb (89.4 kg)     Height 5\' 6"  (1.676 m)     Head Circumference      Peak Flow      Pain Score 0     Pain Loc      Pain Edu?      Excl. in GC?     Constitutional: Alert and oriented. Well appearing and in no acute distress. Eyes: Conjunctivae are normal. PERRL. EOMI. Head:  Atraumatic. Nose: No congestion/rhinnorhea. Mouth/Throat: Mucous membranes are moist.  Oropharynx non-erythematous. Neck: No stridor.  No meningeal signs.   Cardiovascular: Normal rate, regular rhythm. Good peripheral circulation. Grossly normal heart sounds.   Respiratory: Normal respiratory effort.  No retractions. Lungs CTAB. Gastrointestinal: Soft and nontender. No distention.  Musculoskeletal: No lower extremity tenderness nor edema. No gross deformities of extremities. Neurologic:  Normal speech and language. No gross focal neurologic deficits are appreciated. No altered mental status, able to give full seemingly accurate history.  Face is symmetric, EOM's intact, pupils equal and reactive, vision intact, tongue and uvula midline without deviation. Upper and Lower extremity motor 5/5, intact pain perception in distal  extremities, 2+ reflexes in biceps, patella and achilles tendons. Able to perform finger to nose normal with both hands. Walks without assistance or evident ataxia.  Skin:  Skin is warm, dry and intact. No rash noted.   ____________________________________________   LABS (all labs ordered are listed, but only abnormal results are displayed)  Labs Reviewed  CBC WITH DIFFERENTIAL/PLATELET - Abnormal; Notable for the following components:      Result Value   RDW 16.4 (*)    All other components within normal limits  COMPREHENSIVE METABOLIC PANEL - Abnormal; Notable for the following components:   Glucose, Bld 107 (*)    Total Protein 8.2 (*)    All other components within normal limits  I-STAT CHEM 8, ED - Abnormal; Notable for the following components:   Glucose, Bld 103 (*)    Calcium, Ion 1.14 (*)    All other components within normal limits   ____________________________________________   RADIOLOGY  Ct Angio Head W Or Wo Contrast  Result Date: 07/03/2018 CLINICAL DATA:  Intermittent dizziness and nausea EXAM: CT ANGIOGRAPHY HEAD AND NECK TECHNIQUE: Multidetector CT imaging of the head and neck was performed using the standard protocol during bolus administration of intravenous contrast. Multiplanar CT image reconstructions and MIPs were obtained to evaluate the vascular anatomy. Carotid stenosis measurements (when applicable) are obtained utilizing NASCET criteria, using the distal internal carotid diameter as the denominator. CONTRAST:  ISOVUE-370 IOPAMIDOL (ISOVUE-370) INJECTION 76% COMPARISON:  None. FINDINGS: CT HEAD FINDINGS Brain: There is no mass, hemorrhage or extra-axial collection. The size and configuration of the ventricles and extra-axial CSF spaces are normal. There is no acute or chronic infarction. Mild periventricular hypoattenuation, not unexpected for age. Skull: The visualized skull base, calvarium and extracranial soft tissues are normal. Sinuses/Orbits: No  fluid levels or advanced mucosal thickening of the visualized paranasal sinuses. No mastoid or middle ear effusion. The orbits are normal. CTA NECK FINDINGS AORTIC ARCH: There is mild calcific atherosclerosis of the aortic arch. There is no aneurysm, dissection or hemodynamically significant stenosis of the visualized ascending aorta and aortic arch. Conventional 3 vessel aortic branching pattern. The visualized proximal subclavian arteries are widely patent. RIGHT CAROTID SYSTEM: --Common carotid artery: Widely patent origin without common carotid artery dissection or aneurysm. --Internal carotid artery: No dissection, occlusion or aneurysm. No hemodynamically significant stenosis. --External carotid artery: No acute abnormality. LEFT CAROTID SYSTEM: --Common carotid artery: Widely patent origin without common carotid artery dissection or aneurysm. --Internal carotid artery:No dissection, occlusion or aneurysm. No hemodynamically significant stenosis. --External carotid artery: No acute abnormality. VERTEBRAL ARTERIES: Right dominant configuration. Both origins are normal. No dissection, occlusion or flow-limiting stenosis to the vertebrobasilar confluence. SKELETON: There is no bony spinal canal stenosis. No lytic or blastic lesion. OTHER NECK: Hypoattenuating left thyroid nodule measures 1.6 cm. UPPER CHEST: Bilateral  apical predominant emphysema. CTA HEAD FINDINGS ANTERIOR CIRCULATION: --Intracranial internal carotid arteries: Normal. --Anterior cerebral arteries: Normal. Both A1 segments are present. Patent anterior communicating artery. --Middle cerebral arteries: Normal. --Posterior communicating arteries: Present bilaterally. POSTERIOR CIRCULATION: --Basilar artery: Normal. --Posterior cerebral arteries: Normal. --Superior cerebellar arteries: Normal. --Inferior cerebellar arteries: Normal anterior and posterior inferior cerebellar arteries. VENOUS SINUSES: As permitted by contrast timing, patent. ANATOMIC  VARIANTS: Fetal origins of the posterior cerebral arteries. DELAYED PHASE: No parenchymal contrast enhancement. Review of the MIP images confirms the above findings. IMPRESSION: 1. No emergent large vessel occlusion or hemodynamically significant stenosis of the arteries of the head and neck. 2. No acute intracranial abnormality. 3. Aortic Atherosclerosis (ICD10-I70.0) and Emphysema (ICD10-J43.9). Electronically Signed   By: Deatra Robinson M.D.   On: 07/03/2018 22:00   Ct Angio Neck W And/or Wo Contrast  Result Date: 07/03/2018 CLINICAL DATA:  Intermittent dizziness and nausea EXAM: CT ANGIOGRAPHY HEAD AND NECK TECHNIQUE: Multidetector CT imaging of the head and neck was performed using the standard protocol during bolus administration of intravenous contrast. Multiplanar CT image reconstructions and MIPs were obtained to evaluate the vascular anatomy. Carotid stenosis measurements (when applicable) are obtained utilizing NASCET criteria, using the distal internal carotid diameter as the denominator. CONTRAST:  ISOVUE-370 IOPAMIDOL (ISOVUE-370) INJECTION 76% COMPARISON:  None. FINDINGS: CT HEAD FINDINGS Brain: There is no mass, hemorrhage or extra-axial collection. The size and configuration of the ventricles and extra-axial CSF spaces are normal. There is no acute or chronic infarction. Mild periventricular hypoattenuation, not unexpected for age. Skull: The visualized skull base, calvarium and extracranial soft tissues are normal. Sinuses/Orbits: No fluid levels or advanced mucosal thickening of the visualized paranasal sinuses. No mastoid or middle ear effusion. The orbits are normal. CTA NECK FINDINGS AORTIC ARCH: There is mild calcific atherosclerosis of the aortic arch. There is no aneurysm, dissection or hemodynamically significant stenosis of the visualized ascending aorta and aortic arch. Conventional 3 vessel aortic branching pattern. The visualized proximal subclavian arteries are widely patent.  RIGHT CAROTID SYSTEM: --Common carotid artery: Widely patent origin without common carotid artery dissection or aneurysm. --Internal carotid artery: No dissection, occlusion or aneurysm. No hemodynamically significant stenosis. --External carotid artery: No acute abnormality. LEFT CAROTID SYSTEM: --Common carotid artery: Widely patent origin without common carotid artery dissection or aneurysm. --Internal carotid artery:No dissection, occlusion or aneurysm. No hemodynamically significant stenosis. --External carotid artery: No acute abnormality. VERTEBRAL ARTERIES: Right dominant configuration. Both origins are normal. No dissection, occlusion or flow-limiting stenosis to the vertebrobasilar confluence. SKELETON: There is no bony spinal canal stenosis. No lytic or blastic lesion. OTHER NECK: Hypoattenuating left thyroid nodule measures 1.6 cm. UPPER CHEST: Bilateral apical predominant emphysema. CTA HEAD FINDINGS ANTERIOR CIRCULATION: --Intracranial internal carotid arteries: Normal. --Anterior cerebral arteries: Normal. Both A1 segments are present. Patent anterior communicating artery. --Middle cerebral arteries: Normal. --Posterior communicating arteries: Present bilaterally. POSTERIOR CIRCULATION: --Basilar artery: Normal. --Posterior cerebral arteries: Normal. --Superior cerebellar arteries: Normal. --Inferior cerebellar arteries: Normal anterior and posterior inferior cerebellar arteries. VENOUS SINUSES: As permitted by contrast timing, patent. ANATOMIC VARIANTS: Fetal origins of the posterior cerebral arteries. DELAYED PHASE: No parenchymal contrast enhancement. Review of the MIP images confirms the above findings. IMPRESSION: 1. No emergent large vessel occlusion or hemodynamically significant stenosis of the arteries of the head and neck. 2. No acute intracranial abnormality. 3. Aortic Atherosclerosis (ICD10-I70.0) and Emphysema (ICD10-J43.9). Electronically Signed   By: Deatra Robinson M.D.   On: 07/03/2018  22:00    ____________________________________________  PROCEDURES  Procedure(s) performed:   Procedures   ____________________________________________   INITIAL IMPRESSION / ASSESSMENT AND PLAN / ED COURSE  Likely peripheral but with the pain in the back of her neck and this coming right after feeling that snapping feeling concern for vertebral artery dissection or aneurysm we will treat with meclizine, fluids and get CTA..  CTA negative. Symptoms improved. Already has Rx for meclizine at home. Stable for dc.    Pertinent labs & imaging results that were available during my care of the patient were reviewed by me and considered in my medical decision making (see chart for details).  ____________________________________________  FINAL CLINICAL IMPRESSION(S) / ED DIAGNOSES  Final diagnoses:  Vertigo     MEDICATIONS GIVEN DURING THIS VISIT:  Medications  meclizine (ANTIVERT) tablet 25 mg (25 mg Oral Given 07/03/18 2109)  lactated ringers bolus 1,000 mL (0 mLs Intravenous Stopped 07/03/18 2231)  iopamidol (ISOVUE-370) 76 % injection 100 mL (100 mLs Intravenous Contrast Given 07/03/18 2134)     NEW OUTPATIENT MEDICATIONS STARTED DURING THIS VISIT:  Discharge Medication List as of 07/03/2018 11:09 PM      Note:  This note was prepared with assistance of Dragon voice recognition software. Occasional wrong-word or sound-a-like substitutions may have occurred due to the inherent limitations of voice recognition software.   Marily Memos, MD 07/03/18 [id number]

## 2018-08-01 ENCOUNTER — Encounter: Payer: Self-pay | Admitting: Physical Therapy

## 2018-08-01 ENCOUNTER — Ambulatory Visit: Payer: Medicare HMO | Attending: Nurse Practitioner | Admitting: Physical Therapy

## 2018-08-01 ENCOUNTER — Other Ambulatory Visit: Payer: Self-pay

## 2018-08-01 DIAGNOSIS — G8929 Other chronic pain: Secondary | ICD-10-CM | POA: Diagnosis present

## 2018-08-01 DIAGNOSIS — M545 Low back pain: Secondary | ICD-10-CM | POA: Insufficient documentation

## 2018-08-01 NOTE — Therapy (Signed)
[facility] [address] [place], [place], [address] Phone: [phone]   Fax:  [phone]  Physical Therapy Evaluation  Patient Details  Name: Claudia Lewis MRN: [id number] Date of Birth: 03-02-1946 Referring Provider: Brita Romp NP   Encounter Date: 08/01/2018  PT End of Session - 08/01/18 1507    Visit Number  1    Number of Visits  12    Date for PT Re-Evaluation  09/12/18    PT Start Time  0230    Activity Tolerance  Patient tolerated treatment well    Behavior During Therapy  [facility] for tasks assessed/performed       Past Medical History:  Diagnosis Date  . Anemia    a. noted on 11/2017 labs.  . Arthritis   . Hyperlipidemia   . Hypokalemia   . Hypothyroidism   . NSTEMI (non-ST elevated myocardial infarction) (HCC)    a. 11/2017 - cath with normal, tortuous arteries, no culprit, EF normal.    Past Surgical History:  Procedure Laterality Date  . ABDOMINAL HYSTERECTOMY    . LEFT HEART CATH AND CORONARY ANGIOGRAPHY N/A 12/03/2017   Procedure: LEFT HEART CATH AND CORONARY ANGIOGRAPHY;  Surgeon: Runell Gess, MD;  [facility];  Service: Cardiovascular;  Laterality: N/A;    There were no vitals filed for this visit.   Subjective Assessment - 08/01/18 1522    Subjective  The patient presents to OPPT with c/o low back pain that radiates into both hips and into her posterior thighs.  Her pain-level was a 6/10 today but upon rising  first thing in the morning she is in severe pain and walks bent over and she has to sit frequently.  Tylenol decreases her pain.    Pertinent History  Arthritis.    Limitations  Standing    How long can you stand comfortably?  10 mnutes.    How long can you walk comfortably?  Short community distances.    Patient Stated Goals  Decrease pain and walk better especially in the morning.    Currently in Pain?  Yes    Pain Score  6     Pain Location  Back    Pain  Orientation  Mid    Pain Descriptors / Indicators  Aching;Throbbing    Pain Type  Chronic pain    Pain Radiating Towards  Into bilateral posterior thighs.    Pain Onset  More than a month ago    Pain Frequency  Constant    Aggravating Factors   See above.    Pain Relieving Factors  See above.         [facility] PT Assessment - 08/01/18 0001      Assessment   Medical Diagnosis  Spinal stenosis of lumbar region.    Referring Provider  tania Earney Mallet NP    Onset Date/Surgical Date  --   10/2017.     Precautions   Precautions  None      Restrictions   Weight Bearing Restrictions  No      Balance Screen   Has the patient fallen in the past 6 months  No    Has the patient had a decrease in activity level because of a fear of falling?   No    Is the patient reluctant to leave their home because of a fear of falling?   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      Strength   Overall Strength Comments  Normal bilateral LE strength.      Palpation   Palpation comment  Very tender to palpation with overprressure at L5-S1 junction.      Special Tests   Other special tests  Bilateral Patellar reflexes= 3+/4+ and bilateral Achilles= 1+/4+.  (-) SLR and FABER testing.  Equal leg lengths.      Ambulation/Gait   Gait Comments  WNL.                Objective measurements completed on examination: See above findings.      [facility] Adult PT Treatment/Exercise - 08/01/18 0001      Exercises   Exercises  Lumbar      Modalities   Modalities  Electrical Stimulation;Moist Heat      Moist Heat Therapy   Number Minutes Moist Heat  15 Minutes     Moist Heat Location  Lumbar Spine      Electrical Stimulation   Electrical Stimulation Location  Lower lumbar.    Electrical Stimulation Action  IFC    Electrical Stimulation Parameters  80-150 Hz x 15 minutes.    Electrical Stimulation Goals  Pain               PT Short Term Goals - 08/01/18 1719      PT SHORT TERM GOAL #1   Title  STG's=LTG's.      PT SHORT TERM GOAL #2   Title  Stand 20 minutes with pain not > 3/10.    Time  6    Period  Weeks    Status  New      PT SHORT TERM GOAL #3   Title  Walk a community distance with pain not > 3-4/10.    Time  6    Period  Weeks    Status  New      PT SHORT TERM GOAL #4   Title  Eliminate LE symptoms.    Time  6    Period  Weeks    Status  New                Plan - 08/01/18 1554    Clinical Impression Statement  The patient presents to OPPT with c/o low back pain.  Her palpable pain is localized to L5-S1.  She experiences radiation of pain into both posterior thighs.  Her functional mobility is significantly impaired due to pain especially first thing in the morning. Patient will benefit from skilled physical therapy intervention to address deficits.     History and Personal Factors relevant to plan of care:  Arhtrits.    Clinical Presentation  Evolving    Clinical Presentation due to:  Not improving.    Rehab Potential  Good    PT Frequency  2x / week    PT Duration  6 weeks    PT Treatment/Interventions  ADLs/Self Care Home Management;Cryotherapy;Electrical Stimulation;Ultrasound;Moist Heat;Therapeutic activities;Therapeutic exercise;Patient/family education;Manual techniques;Dry needling    PT Next Visit Plan  HMP and e'stim; Combo e'stim/U/S at STW/m; S and DKTC;  hip bridges; mini-crunches.    Consulted and Agree with Plan of Care  Patient       Patient will benefit from skilled therapeutic intervention in order to improve the following deficits and impairments:  Pain, Decreased activity  tolerance  Visit Diagnosis: Chronic bilateral low back pain, with sciatica presence unspecified     Problem List Patient Active Problem List   Diagnosis Date Noted  . NSTEMI (non-ST elevated myocardial infarction) (HCC) 12/03/2017    Cadyn Fann, [place] MPT 08/01/2018, 5:29 PM  [facility] [address] [place], [place], [address] Phone: [phone]   Fax:  [phone]  Name: Claudia Lewis MRN: [id number] Date of Birth: Nov 26, 1946

## 2018-08-02 NOTE — Addendum Note (Signed)
Addended by: Akim Watkinson, [place] W on: 08/02/2018 12:00 PM   Modules accepted: Orders

## 2018-08-05 ENCOUNTER — Ambulatory Visit: Payer: Medicare HMO | Admitting: Physical Therapy

## 2018-08-05 ENCOUNTER — Encounter: Payer: Self-pay | Admitting: Physical Therapy

## 2018-08-05 DIAGNOSIS — M545 Low back pain: Secondary | ICD-10-CM | POA: Diagnosis not present

## 2018-08-05 DIAGNOSIS — G8929 Other chronic pain: Secondary | ICD-10-CM

## 2018-08-05 NOTE — Therapy (Signed)
[facility] [address] [place], [place], [address] Phone: [phone]   Fax:  [phone]  Physical Therapy Treatment  Patient Details  Name: SHEIKA COUTTS MRN: [id number] Date of Birth: 1946/11/27 Referring Provider: Brita Romp NP   Encounter Date: 08/05/2018  PT End of Session - 08/05/18 1125    Visit Number  2    Number of Visits  12    Date for PT Re-Evaluation  09/12/18    PT Start Time  1118    PT Stop Time  1158    PT Time Calculation (min)  40 min    Activity Tolerance  Patient tolerated treatment well    Behavior During Therapy  [facility] for tasks assessed/performed       Past Medical History:  Diagnosis Date  . Anemia    a. noted on 11/2017 labs.  . Arthritis   . Hyperlipidemia   . Hypokalemia   . Hypothyroidism   . NSTEMI (non-ST elevated myocardial infarction) (HCC)    a. 11/2017 - cath with normal, tortuous arteries, no culprit, EF normal.    Past Surgical History:  Procedure Laterality Date  . ABDOMINAL HYSTERECTOMY    . LEFT HEART CATH AND CORONARY ANGIOGRAPHY N/A 12/03/2017   Procedure: LEFT HEART CATH AND CORONARY ANGIOGRAPHY;  Surgeon: Runell Gess, MD;  [facility];  Service: Cardiovascular;  Laterality: N/A;    There were no vitals filed for this visit.  Subjective Assessment - 08/05/18 1121    Subjective  Patient presents with reports of 0/10 pain and intermittant reports of LLE pain.     Pertinent History  Arthritis.    Limitations  Standing    How long can you stand comfortably?  10 mnutes.    How long can you walk comfortably?  Short community distances.    Patient Stated Goals  Decrease pain and walk better especially in the morning.    Currently in Pain?  No/denies         [facility] PT Assessment - 08/05/18 0001      Assessment   Medical Diagnosis  Spinal stenosis of lumbar region.      Precautions   Precautions  None      Restrictions   Weight Bearing  Restrictions  No                   [facility] Adult PT Treatment/Exercise - 08/05/18 0001      Exercises   Exercises  Lumbar      Lumbar Exercises: Stretches   Single Knee to Chest Stretch  Right;Left;2 reps;30 seconds      Lumbar Exercises: Aerobic   Nustep  L3 x15 min      Lumbar Exercises: Standing   Scapular Retraction  Strengthening;Both;20 reps;Limitations    Scapular Retraction Limitations  Pink XTS with posture cues      Lumbar Exercises: Supine   Ab Set  10 reps;5 seconds    Clam  20 reps;Limitations    Clam Limitations  green theraband    Bent Knee Raise  15 reps    Bridge  20 reps;2 seconds    Straight Leg Raise  15 reps;2 seconds    Other Supine Lumbar Exercises  Instructed in log rolling technique                PT Short Term Goals - 08/01/18 1719      PT SHORT TERM GOAL #1   Title  STG's=LTG's.  PT SHORT TERM GOAL #2   Title  Stand 20 minutes with pain not > 3/10.    Time  6    Period  Weeks    Status  New      PT SHORT TERM GOAL #3   Title  Walk a community distance with pain not > 3-4/10.    Time  6    Period  Weeks    Status  New      PT SHORT TERM GOAL #4   Title  Eliminate LE symptoms.    Time  6    Period  Weeks    Status  New               Plan - 08/05/18 1205    Clinical Impression Statement  Patient tolerated today's treatment very well as she arrived with no pain. Patient reported improvement overall in symptoms and function. Patient instructed in proper posture and the importance in regards to her lumbar spine health as well. Patient instructed in the proper core activation technique for the foundation of all therex session. Patient able to complete fairly well but may require greater instruction in future appointments in order to improve core strength. Patient denied any pain during therex and after end of session. No modalities completed as patient denied pain. Patient also educated in log rolling technique  and to avoid any further injury.    Rehab Potential  Good    PT Frequency  2x / week    PT Duration  6 weeks    PT Treatment/Interventions  ADLs/Self Care Home Management;Cryotherapy;Electrical Stimulation;Ultrasound;Moist Heat;Therapeutic activities;Therapeutic exercise;Patient/family education;Manual techniques;Dry needling    PT Next Visit Plan  Provided HEP with low level core strengthening next treatment.    Consulted and Agree with Plan of Care  Patient       Patient will benefit from skilled therapeutic intervention in order to improve the following deficits and impairments:  Pain, Decreased activity tolerance  Visit Diagnosis: Chronic bilateral low back pain, with sciatica presence unspecified     Problem List Patient Active Problem List   Diagnosis Date Noted  . NSTEMI (non-ST elevated myocardial infarction) (HCC) 12/03/2017    Marvell Fuller, PTA 08/05/2018, 12:09 PM  [facility] [address] [place], [place], [address] Phone: [phone]   Fax:  [phone]  Name: ROSAELENA KEMNITZ MRN: [id number] Date of Birth: 1946/05/20

## 2018-08-08 ENCOUNTER — Ambulatory Visit: Payer: Medicare HMO | Admitting: Physical Therapy

## 2018-08-08 ENCOUNTER — Encounter: Payer: Self-pay | Admitting: Physical Therapy

## 2018-08-08 DIAGNOSIS — M545 Low back pain: Principal | ICD-10-CM

## 2018-08-08 DIAGNOSIS — G8929 Other chronic pain: Secondary | ICD-10-CM

## 2018-08-08 NOTE — Therapy (Signed)
[facility] [address] [place], [place], [address] Phone: [phone]   Fax:  [phone]  Physical Therapy Treatment  Patient Details  Name: NYGERIA LAGER MRN: [id number] Date of Birth: 07/07/1946 Referring Provider: Brita Romp NP   Encounter Date: 08/08/2018  PT End of Session - 08/08/18 1155    Visit Number  3    Number of Visits  12    Date for PT Re-Evaluation  09/12/18    PT Start Time  1114    PT Stop Time  1154    PT Time Calculation (min)  40 min    Activity Tolerance  Patient tolerated treatment well    Behavior During Therapy  [facility] for tasks assessed/performed       Past Medical History:  Diagnosis Date  . Anemia    a. noted on 11/2017 labs.  . Arthritis   . Hyperlipidemia   . Hypokalemia   . Hypothyroidism   . NSTEMI (non-ST elevated myocardial infarction) (HCC)    a. 11/2017 - cath with normal, tortuous arteries, no culprit, EF normal.    Past Surgical History:  Procedure Laterality Date  . ABDOMINAL HYSTERECTOMY    . LEFT HEART CATH AND CORONARY ANGIOGRAPHY N/A 12/03/2017   Procedure: LEFT HEART CATH AND CORONARY ANGIOGRAPHY;  Surgeon: Runell Gess, MD;  [facility];  Service: Cardiovascular;  Laterality: N/A;    There were no vitals filed for this visit.  Subjective Assessment - 08/08/18 1119    Subjective  Patient arrived with no new complaints and reported doing well after last treatment    Pertinent History  Arthritis.    Limitations  Standing    How long can you stand comfortably?  10 minutes    How long can you walk comfortably?  Short community distances.    Patient Stated Goals  Decrease pain and walk better especially in the morning.    Currently in Pain?  No/denies                       [facility] Adult PT Treatment/Exercise - 08/08/18 0001      Self-Care   Self-Care  ADL's;Posture;Lifting    ADL's  vacuum, mopping, brushing teeth with good  posture    Lifting  power lift    Posture  all positons, bending with correct technique      Exercises   Exercises  Lumbar      Lumbar Exercises: Aerobic   Nustep  L3 x8 1/2  min      Lumbar Exercises: Standing   Scapular Retraction  Strengthening;Both;20 reps;Limitations;10 reps    Scapular Retraction Limitations  Pink XTS with posture cues    Shoulder Extension  Strengthening;Both;20 reps;Theraband;10 reps    Shoulder Extension Limitations  pink XTS      Lumbar Exercises: Supine   Ab Set  20 reps;3 seconds    Glut Set  20 reps    Clam  20 reps;Limitations    Clam Limitations  green theraband    Bent Knee Raise  3 seconds   2x20   Bridge  20 reps    Straight Leg Raise  3 seconds   2x10            PT Education - 08/08/18 1139    Education Details  HEP core activation and progression    Person(s) Educated  Patient    Methods  Explanation;Demonstration;Handout    Comprehension  Verbalized understanding;Returned  demonstration       PT Short Term Goals - 08/08/18 1139      PT SHORT TERM GOAL #2   Title  Stand 20 minutes with pain not > 3/10.    Time  6    Period  Weeks    Status  On-going      PT SHORT TERM GOAL #3   Title  Walk a community distance with pain not > 3-4/10.    Time  6    Period  Weeks    Status  On-going      PT SHORT TERM GOAL #4   Title  Eliminate LE symptoms.    Time  6    Period  Weeks    Status  On-going   improved yet ongoing 08/08/18              Plan - 08/08/18 1158    Clinical Impression Statement  Patient tolerated treatment well today. patient has no pain in back and only some ongoing symptoms going down bil LE yet not as much as prior to therapy. Patient educated on posture awareness techniques for ADL's posture, lifting and bending then core activation and progression with HEP provided. Patient has reported increased discomfort with any prolong walking or prolong sitting. Patient progressing toward goals yet ongoing at  this time.     Rehab Potential  Good    PT Frequency  2x / week    PT Duration  6 weeks    PT Treatment/Interventions  ADLs/Self Care Home Management;Cryotherapy;Electrical Stimulation;Ultrasound;Moist Heat;Therapeutic activities;Therapeutic exercise;Patient/family education;Manual techniques;Dry needling    PT Next Visit Plan  cont with POC for core strengthening and progression per tolerance    Consulted and Agree with Plan of Care  Patient       Patient will benefit from skilled therapeutic intervention in order to improve the following deficits and impairments:  Pain, Decreased activity tolerance  Visit Diagnosis: Chronic bilateral low back pain, with sciatica presence unspecified     Problem List Patient Active Problem List   Diagnosis Date Noted  . NSTEMI (non-ST elevated myocardial infarction) (HCC) 12/03/2017    Jaiden Dinkins P, PTA 08/08/2018, 12:02 PM  [facility] [address] [place], [place], [address] Phone: [phone]   Fax:  [phone]  Name: SHULAMIS WENBERG MRN: [id number] Date of Birth: January 02, 1946

## 2018-08-08 NOTE — Patient Instructions (Signed)
Pelvic Tilt: Posterior - Legs Bent (Supine)  Tighten stomach and flatten back by rolling pelvis down. Hold _10___ seconds. Relax. Repeat _10-30___ times per set. Do __2__ sets per session. Do _2___ sessions per day.   Bent Leg Lift (Hook-Lying)  Tighten stomach and slowly raise right leg _5___ inches from floor. Keep trunk rigid. Hold _3___ seconds. Repeat _10___ times per set. Do ___2-3_ sets per session. Do __2__ sessions per day.   Straight Leg Raise  Tighten stomach and slowly raise locked right leg __4__ inches from floor. Repeat __10-30__ times per set. Do __2__ sets per session. Do __2__ sessions per day.  Bridging  Slowly raise buttocks from floor, keeping stomach tight. Repeat _10___ times per set. Do __2__ sets per session. Do __2__ sessions per day.     Scapular Retraction: Bilateral  Facing anchor, pull arms back, bringing shoulder blades together. Repeat _30___ times per set. Do __2-3__ sets per session. Do _2___ sessions per day.    Standing lat pull with theraband  Anchor bands higher than your head. Start with your arms straight out in front of you at shoulder height (or a little above).  Pull bands down next to your body and then slowly return to the starting position.  10-30 x1day    

## 2018-08-12 ENCOUNTER — Encounter: Payer: Medicare HMO | Admitting: Physical Therapy

## 2018-08-14 ENCOUNTER — Encounter: Payer: Self-pay | Admitting: Physical Therapy

## 2018-08-14 ENCOUNTER — Ambulatory Visit: Payer: Medicare HMO | Admitting: Physical Therapy

## 2018-08-14 DIAGNOSIS — M545 Low back pain: Principal | ICD-10-CM

## 2018-08-14 DIAGNOSIS — G8929 Other chronic pain: Secondary | ICD-10-CM

## 2018-08-14 NOTE — Therapy (Signed)
[facility] [address] [place], [place], [address] Phone: [phone]   Fax:  [phone]  Physical Therapy Treatment  Patient Details  Name: Claudia Lewis MRN: [id number] Date of Birth: Mar 29, 1946 Referring Provider: Brita Romp NP   Encounter Date: 08/14/2018  PT End of Session - 08/14/18 0932    Visit Number  4    Number of Visits  12    Date for PT Re-Evaluation  09/12/18    PT Start Time  0900    PT Stop Time  0942    PT Time Calculation (min)  42 min    Activity Tolerance  Patient tolerated treatment well    Behavior During Therapy  [facility] for tasks assessed/performed       Past Medical History:  Diagnosis Date  . Anemia    a. noted on 11/2017 labs.  . Arthritis   . Hyperlipidemia   . Hypokalemia   . Hypothyroidism   . NSTEMI (non-ST elevated myocardial infarction) (HCC)    a. 11/2017 - cath with normal, tortuous arteries, no culprit, EF normal.    Past Surgical History:  Procedure Laterality Date  . ABDOMINAL HYSTERECTOMY    . LEFT HEART CATH AND CORONARY ANGIOGRAPHY N/A 12/03/2017   Procedure: LEFT HEART CATH AND CORONARY ANGIOGRAPHY;  Surgeon: Runell Gess, MD;  [facility];  Service: Cardiovascular;  Laterality: N/A;    There were no vitals filed for this visit.  Subjective Assessment - 08/14/18 0911    Subjective  Patient reported doing overall ok with no new complaints    Pertinent History  Arthritis.    Limitations  Standing    How long can you stand comfortably?  10 minutes    How long can you walk comfortably?  Short community distances.    Patient Stated Goals  Decrease pain and walk better especially in the morning.    Currently in Pain?  No/denies                       [facility] Adult PT Treatment/Exercise - 08/14/18 0001      Exercises   Exercises  Lumbar      Lumbar Exercises: Aerobic   Nustep  L4 x87min UE/LE activity, monitored    Other Aerobic  Exercise  standing with core activation on rockerboard x65min      Lumbar Exercises: Standing   Scapular Retraction  Strengthening;Both;20 reps;Limitations;10 reps    Scapular Retraction Limitations  Pink XTS with posture cues    Shoulder Extension  Strengthening;Both;20 reps;Theraband;10 reps    Shoulder Extension Limitations  pink XTS    Other Standing Lumbar Exercises  2# ball for step outs and D1/D2 with core actiation 2x10-15 each    Other Standing Lumbar Exercises  6" step ups 2x10 with core activation      Lumbar Exercises: Supine   Clam  20 reps;Limitations;10 reps    Clam Limitations  green theraband    Bent Knee Raise  3 seconds   2x20   Bridge  20 reps    Straight Leg Raise  3 seconds   2x15              PT Short Term Goals - 08/08/18 1139      PT SHORT TERM GOAL #2   Title  Stand 20 minutes with pain not > 3/10.    Time  6    Period  Weeks    Status  On-going      PT SHORT TERM GOAL #3   Title  Walk a community distance with pain not > 3-4/10.    Time  6    Period  Weeks    Status  On-going      PT SHORT TERM GOAL #4   Title  Eliminate LE symptoms.    Time  6    Period  Weeks    Status  On-going   improved yet ongoing 08/08/18              Plan - 08/14/18 0933    Clinical Impression Statement  Patient tolerated treatment well today. Patient reported no pain pre or post treatment. Patient able to progress with standing exercises today with no difficulty. Patient has improved activity tolerance and has good understanding of core activation and progression. Patient progressing toward goals.     Rehab Potential  Good    PT Frequency  2x / week    PT Duration  6 weeks    PT Treatment/Interventions  ADLs/Self Care Home Management;Cryotherapy;Electrical Stimulation;Ultrasound;Moist Heat;Therapeutic activities;Therapeutic exercise;Patient/family education;Manual techniques;Dry needling    PT Next Visit Plan  cont with POC for core strengthening and  progression per tolerance    Consulted and Agree with Plan of Care  Patient       Patient will benefit from skilled therapeutic intervention in order to improve the following deficits and impairments:  Pain, Decreased activity tolerance  Visit Diagnosis: Chronic bilateral low back pain, with sciatica presence unspecified     Problem List Patient Active Problem List   Diagnosis Date Noted  . NSTEMI (non-ST elevated myocardial infarction) (HCC) 12/03/2017    DUNFORD, CHRISTINA P, PTA 08/14/2018, 9:44 AM  [facility] [address] [place], [place], [address] Phone: [phone]   Fax:  [phone]  Name: Claudia Lewis MRN: [id number] Date of Birth: 04-Nov-1946

## 2018-08-15 ENCOUNTER — Ambulatory Visit: Payer: Medicare HMO | Admitting: Physical Therapy

## 2018-08-16 ENCOUNTER — Encounter: Payer: Self-pay | Admitting: Physical Therapy

## 2018-08-16 ENCOUNTER — Ambulatory Visit: Payer: Medicare HMO | Admitting: Physical Therapy

## 2018-08-16 DIAGNOSIS — M545 Low back pain: Secondary | ICD-10-CM | POA: Diagnosis not present

## 2018-08-16 DIAGNOSIS — G8929 Other chronic pain: Secondary | ICD-10-CM

## 2018-08-16 NOTE — Therapy (Signed)
[facility] [address] [place], [place], [address] Phone: [phone]   Fax:  [phone]  Physical Therapy Treatment  Patient Details  Name: Claudia Lewis MRN: [id number] Date of Birth: November 14, 1946 Referring Provider: Brita Romp NP   Encounter Date: 08/16/2018  PT End of Session - 08/16/18 1123    Visit Number  5    Number of Visits  12    Date for PT Re-Evaluation  09/12/18    PT Start Time  1115    PT Stop Time  1200    PT Time Calculation (min)  45 min    Activity Tolerance  Patient tolerated treatment well    Behavior During Therapy  [facility] for tasks assessed/performed       Past Medical History:  Diagnosis Date  . Anemia    a. noted on 11/2017 labs.  . Arthritis   . Hyperlipidemia   . Hypokalemia   . Hypothyroidism   . NSTEMI (non-ST elevated myocardial infarction) (HCC)    a. 11/2017 - cath with normal, tortuous arteries, no culprit, EF normal.    Past Surgical History:  Procedure Laterality Date  . ABDOMINAL HYSTERECTOMY    . LEFT HEART CATH AND CORONARY ANGIOGRAPHY N/A 12/03/2017   Procedure: LEFT HEART CATH AND CORONARY ANGIOGRAPHY;  Surgeon: Runell Gess, MD;  [facility];  Service: Cardiovascular;  Laterality: N/A;    There were no vitals filed for this visit.  Subjective Assessment - 08/16/18 1128    Subjective  Patient reported "feeling it" in her back a little more and rated pain currently at 4/10    Pertinent History  Arthritis.    Limitations  Standing    How long can you stand comfortably?  10 minutes    How long can you walk comfortably?  Short community distances.    Patient Stated Goals  Decrease pain and walk better especially in the morning.    Currently in Pain?  Yes    Pain Score  4     Pain Location  Back    Pain Orientation  Mid    Pain Descriptors / Indicators  Aching;Throbbing    Pain Type  Chronic pain    Pain Onset  More than a month ago    Pain  Frequency  Constant         [facility] PT Assessment - 08/16/18 0001      Assessment   Medical Diagnosis  Spinal stenosis of lumbar region.      Precautions   Precautions  None      Restrictions   Weight Bearing Restrictions  No                   [facility] Adult PT Treatment/Exercise - 08/16/18 0001      Exercises   Exercises  Lumbar      Lumbar Exercises: Stretches   Single Knee to Chest Stretch  Right;Left;3 reps;30 seconds    Double Knee to Chest Stretch  2 reps;30 seconds      Lumbar Exercises: Aerobic   Nustep  L3 x34min UE/LE activity, monitored      Lumbar Exercises: Standing   Heel Raises  20 reps    Scapular Retraction  Strengthening;Both;20 reps;10 reps;Limitations    Scapular Retraction Limitations  Pink XTS with posture cues    Shoulder Extension  Strengthening;Both;20 reps;10 reps;Theraband    Shoulder Extension Limitations  Pink XTS    Other Standing Lumbar Exercises  lateral stepping at countertop x2 minutes    Other Standing Lumbar Exercises  marching with draw in x20 each      Lumbar Exercises: Supine   Clam  20 reps;Limitations;10 reps    Clam Limitations  green theraband    Bent Knee Raise  3 seconds;20 reps    Bridge with [place]  Compliant;20 reps               PT Short Term Goals - 08/08/18 1139      PT SHORT TERM GOAL #2   Title  Stand 20 minutes with pain not > 3/10.    Time  6    Period  Weeks    Status  On-going      PT SHORT TERM GOAL #3   Title  Walk a community distance with pain not > 3-4/10.    Time  6    Period  Weeks    Status  On-going      PT SHORT TERM GOAL #4   Title  Eliminate LE symptoms.    Time  6    Period  Weeks    Status  On-going   improved yet ongoing 08/08/18              Plan - 08/16/18 1153    Clinical Impression Statement  Patient was able to tolerate treatment well with no reports of pain. just muscle fatigue. Patient was able to demonstrate good form after demonstration  with all new exercises. Patient instructed to continue HEP to optimize therapy. Patient reported understanding.     Clinical Presentation  Evolving    Rehab Potential  Good    PT Frequency  2x / week    PT Duration  6 weeks    PT Treatment/Interventions  ADLs/Self Care Home Management;Cryotherapy;Electrical Stimulation;Ultrasound;Moist Heat;Therapeutic activities;Therapeutic exercise;Patient/family education;Manual techniques;Dry needling    PT Next Visit Plan  cont with POC for core strengthening and progression per tolerance    Consulted and Agree with Plan of Care  Patient       Patient will benefit from skilled therapeutic intervention in order to improve the following deficits and impairments:  Pain, Decreased activity tolerance  Visit Diagnosis: Chronic bilateral low back pain, with sciatica presence unspecified     Problem List Patient Active Problem List   Diagnosis Date Noted  . NSTEMI (non-ST elevated myocardial infarction) (HCC) 12/03/2017   Guss Bunde, PT, DPT 08/16/2018, 12:12 PM  [facility] [address] [place], [place], [address] Phone: [phone]   Fax:  [phone]  Name: Claudia Lewis MRN: [id number] Date of Birth: 1945-12-05

## 2018-08-20 ENCOUNTER — Ambulatory Visit: Payer: Medicare HMO | Admitting: Physical Therapy

## 2018-08-20 ENCOUNTER — Encounter: Payer: Self-pay | Admitting: Physical Therapy

## 2018-08-20 DIAGNOSIS — M545 Low back pain: Secondary | ICD-10-CM | POA: Diagnosis not present

## 2018-08-20 DIAGNOSIS — G8929 Other chronic pain: Secondary | ICD-10-CM

## 2018-08-20 NOTE — Therapy (Signed)
[facility] [place], [place], [address] Phone: [phone]   Fax:  [phone]  Physical Therapy Treatment  Patient Details  Name: UNDRA TREMBATH MRN: [id number] Date of Birth: [birth year] Referring Provider: Sharol Given NP   Encounter Date: 08/20/2018  PT End of Session - 08/20/18 1311    Visit Number  6    Number of Visits  12    Date for PT Re-Evaluation  09/12/18    PT Start Time  1300    PT Stop Time  8756    PT Time Calculation (min)  47 min    Activity Tolerance  Patient tolerated treatment well    Behavior During Therapy  [facility] for tasks assessed/performed       Past Medical History:  Diagnosis Date  . Anemia    a. noted on 11/2017 labs.  . Arthritis   . Hyperlipidemia   . Hypokalemia   . Hypothyroidism   . NSTEMI (non-ST elevated myocardial infarction) ([place])    a. 11/2017 - cath with normal, tortuous arteries, no culprit, EF normal.    Past Surgical History:  Procedure Laterality Date  . ABDOMINAL HYSTERECTOMY    . LEFT HEART CATH AND CORONARY ANGIOGRAPHY N/A 12/03/2017   Procedure: LEFT HEART CATH AND CORONARY ANGIOGRAPHY;  Surgeon: Lorretta Harp, MD;  Location: [place] [facility];  Service: Cardiovascular;  Laterality: N/A;    There were no vitals filed for this visit.  Subjective Assessment - 08/20/18 1300    Subjective  Patient reports "still at a 4/10"    Pertinent History  Arthritis.    Limitations  Standing    How long can you stand comfortably?  10 minutes    How long can you walk comfortably?  Short community distances.    Patient Stated Goals  Decrease pain and walk better especially in the morning.    Currently in Pain?  Yes    Pain Score  4     Pain Location  Back    Pain Orientation  Mid    Pain Descriptors / Indicators  Aching    Pain Type  Chronic pain    Pain Onset  More than a month ago    Pain Frequency  Constant         [facility] PT Assessment - 08/20/18 0001       Assessment   Medical Diagnosis  Spinal stenosis of lumbar region.      Precautions   Precautions  None      Restrictions   Weight Bearing Restrictions  No                   [facility] Adult PT Treatment/Exercise - 08/20/18 0001      Exercises   Exercises  Lumbar      Lumbar Exercises: Stretches   Single Knee to Chest Stretch  Right;Left;2 reps;60 seconds    Lower Trunk Rotation  4 reps;30 seconds      Lumbar Exercises: Aerobic   Nustep  L4 x70mn UE/LE activity, monitored      Lumbar Exercises: Standing   Scapular Retraction  Strengthening;Both;Limitations;Other (comment)    Scapular Retraction Limitations  Pink XTS with posture cues x2 minutes    Other Standing Lumbar Exercises  2# ball step outs followed by wood chops 2x10 each    Other Standing Lumbar Exercises  lat pull downs 2x10 orange XTS      Lumbar Exercises: Supine   Clam  20  reps;Limitations;10 reps    Clam Limitations  green theraband    Bent Knee Raise  5 seconds   x2 minutes   Bridge with Ball Squeeze  Compliant;20 reps    Straight Leg Raise  3 seconds   3x10              PT Short Term Goals - 08/20/18 1345      PT SHORT TERM GOAL #2   Title  Stand 20 minutes with pain not > 3/10.    Time  6    Period  Weeks    Status  Achieved      PT SHORT TERM GOAL #3   Title  Walk a community distance with pain not > 3-4/10.    Time  6    Period  Weeks    Status  Achieved      PT SHORT TERM GOAL #4   Title  Eliminate LE symptoms.    Time  6    Period  Weeks    Status  On-going   Ongoing, to bilateral calves              Plan - 08/20/18 1353    Clinical Impression Statement  Patient was able to tolerate treatment well with no reports of pain. Patient was able to demonstrate good form with all exercises. Patient has met STG#2 and #3; patient continues to have symptoms down bilateral LEs to calves. Patient instructed to add single knee to chest stretch and lower trunk rotations to  HEP. Patient reported agreement.     Clinical Presentation  Evolving    PT Frequency  2x / week    PT Duration  6 weeks    PT Treatment/Interventions  ADLs/Self Care Home Management;Cryotherapy;Electrical Stimulation;Ultrasound;Moist Heat;Therapeutic activities;Therapeutic exercise;Patient/family education;Manual techniques;Dry needling    PT Next Visit Plan  cont with POC for core strengthening and progression per tolerance    Consulted and Agree with Plan of Care  Patient       Patient will benefit from skilled therapeutic intervention in order to improve the following deficits and impairments:  Pain, Decreased activity tolerance  Visit Diagnosis: Chronic bilateral low back pain, with sciatica presence unspecified     Problem List Patient Active Problem List   Diagnosis Date Noted  . NSTEMI (non-ST elevated myocardial infarction) ([place]) 12/03/2017   Gabriela Eves, PT, DPT 08/20/2018, 1:57 PM  [facility] [place], [place], [address] Phone: [phone]   Fax:  [phone]  Name: DEMARA LOVER MRN: [id number] Date of Birth: 12-17-1945

## 2018-08-23 ENCOUNTER — Ambulatory Visit: Payer: Medicare HMO | Admitting: *Deleted

## 2018-08-23 DIAGNOSIS — M545 Low back pain: Secondary | ICD-10-CM | POA: Diagnosis not present

## 2018-08-23 DIAGNOSIS — G8929 Other chronic pain: Secondary | ICD-10-CM

## 2018-08-23 NOTE — Therapy (Signed)
[facility] [address] [place], [place], [address] Phone: [phone]   Fax:  [phone]  Physical Therapy Treatment  Patient Details  Name: Claudia Lewis MRN: [id number] Date of Birth: June 21, 1946 Referring Provider (PT): tania Earney Mallet NP   Encounter Date: 08/23/2018  PT End of Session - 08/23/18 0911    Visit Number  7    Number of Visits  12    Date for PT Re-Evaluation  09/12/18    PT Start Time  0903    PT Stop Time  0951    PT Time Calculation (min)  48 min       Past Medical History:  Diagnosis Date  . Anemia    a. noted on 11/2017 labs.  . Arthritis   . Hyperlipidemia   . Hypokalemia   . Hypothyroidism   . NSTEMI (non-ST elevated myocardial infarction) (HCC)    a. 11/2017 - cath with normal, tortuous arteries, no culprit, EF normal.    Past Surgical History:  Procedure Laterality Date  . ABDOMINAL HYSTERECTOMY    . LEFT HEART CATH AND CORONARY ANGIOGRAPHY N/A 12/03/2017   Procedure: LEFT HEART CATH AND CORONARY ANGIOGRAPHY;  Surgeon: Runell Gess, MD;  [facility];  Service: Cardiovascular;  Laterality: N/A;    There were no vitals filed for this visit.  Subjective Assessment - 08/23/18 0907    Subjective  My LBP is about the same 4/10. Did ok after last Rx    Pertinent History  Arthritis.    Limitations  Standing    How long can you stand comfortably?  10 minutes    How long can you walk comfortably?  Short community distances.    Patient Stated Goals  Decrease pain and walk better especially in the morning.    Currently in Pain?  Yes    Pain Score  4     Pain Location  Back    Pain Orientation  Mid    Pain Descriptors / Indicators  Aching    Pain Type  Chronic pain    Pain Onset  More than a month ago    Pain Frequency  Constant                       [facility] Adult PT Treatment/Exercise - 08/23/18 0001      Exercises   Exercises  Lumbar      Lumbar  Exercises: Stretches   Single Knee to Chest Stretch  Right;Left;2 reps;60 seconds    Lower Trunk Rotation  4 reps;30 seconds      Lumbar Exercises: Aerobic   Nustep  L5 x65min UE/LE activity, monitored      Lumbar Exercises: Standing   Scapular Retraction  Strengthening;Both;Limitations;Other (comment)    Scapular Retraction Limitations  Pink XTS with posture cues x20,   Wood chops 2x 10 each side    Other Standing Lumbar Exercises  2# ball step outs x 10 each side followed by wood chops x10 each side    Other Standing Lumbar Exercises  lat pull downs 3x10 pink XTS      Lumbar Exercises: Supine   Clam  20 reps;Limitations;10 reps    Clam Limitations  green theraband    Bent Knee Raise  5 seconds;20 reps    Bridge with Ball Squeeze  Compliant;20 reps    Straight Leg Raise  3 seconds   3x10  PT Short Term Goals - 08/20/18 1345      PT SHORT TERM GOAL #2   Title  Stand 20 minutes with pain not > 3/10.    Time  6    Period  Weeks    Status  Achieved      PT SHORT TERM GOAL #3   Title  Walk a community distance with pain not > 3-4/10.    Time  6    Period  Weeks    Status  Achieved      PT SHORT TERM GOAL #4   Title  Eliminate LE symptoms.    Time  6    Period  Weeks    Status  On-going   Ongoing, to bilateral calves              Plan - 08/23/18 0917    Clinical Impression Statement  Pt arrived today doing fairly well with LBP 4/10, but still with symptoms in both LEs (LTG NM yet).She ddoes report that the symptoms in her legs are less since starting PT though. She was able to perform all therex for core strengthening and stretching exs without increased pain.,    PT Frequency  2x / week    PT Duration  6 weeks    PT Treatment/Interventions  ADLs/Self Care Home Management;Cryotherapy;Electrical Stimulation;Ultrasound;Moist Heat;Therapeutic activities;Therapeutic exercise;Patient/family education;Manual techniques;Dry needling    PT Next Visit  Plan  cont with POC for core strengthening and progression per tolerance    Consulted and Agree with Plan of Care  Patient       Patient will benefit from skilled therapeutic intervention in order to improve the following deficits and impairments:     Visit Diagnosis: Chronic bilateral low back pain, with sciatica presence unspecified     Problem List Patient Active Problem List   Diagnosis Date Noted  . NSTEMI (non-ST elevated myocardial infarction) (HCC) 12/03/2017    Shirely Toren,CHRIS, PTA 08/23/2018, 10:28 AM  [facility] [address] [place], [place], [address] Phone: [phone]   Fax:  [phone]  Name: Claudia Lewis MRN: [id number] Date of Birth: 31-Jan-1946

## 2018-08-27 ENCOUNTER — Encounter: Payer: Self-pay | Admitting: Physical Therapy

## 2018-08-27 ENCOUNTER — Ambulatory Visit: Payer: Medicare HMO | Attending: Nurse Practitioner | Admitting: Physical Therapy

## 2018-08-27 DIAGNOSIS — M5440 Lumbago with sciatica, unspecified side: Secondary | ICD-10-CM | POA: Insufficient documentation

## 2018-08-27 DIAGNOSIS — G8929 Other chronic pain: Secondary | ICD-10-CM

## 2018-08-27 NOTE — Therapy (Signed)
[facility] [address] [place], [place], [address] Phone: [phone]   Fax:  [phone]  Physical Therapy Treatment  Patient Details  Name: Claudia Lewis MRN: [id number] Date of Birth: 08-09-1946 Referring Provider (PT): tania Earney Mallet NP   Encounter Date: 08/27/2018  PT End of Session - 08/27/18 1305    Visit Number  8    Number of Visits  12    Date for PT Re-Evaluation  09/12/18    PT Start Time  1300    PT Stop Time  1343    PT Time Calculation (min)  43 min    Activity Tolerance  Patient tolerated treatment well    Behavior During Therapy  [facility] for tasks assessed/performed       Past Medical History:  Diagnosis Date  . Anemia    a. noted on 11/2017 labs.  . Arthritis   . Hyperlipidemia   . Hypokalemia   . Hypothyroidism   . NSTEMI (non-ST elevated myocardial infarction) (HCC)    a. 11/2017 - cath with normal, tortuous arteries, no culprit, EF normal.    Past Surgical History:  Procedure Laterality Date  . ABDOMINAL HYSTERECTOMY    . LEFT HEART CATH AND CORONARY ANGIOGRAPHY N/A 12/03/2017   Procedure: LEFT HEART CATH AND CORONARY ANGIOGRAPHY;  Surgeon: Runell Gess, MD;  [facility];  Service: Cardiovascular;  Laterality: N/A;    There were no vitals filed for this visit.  Subjective Assessment - 08/27/18 1304    Subjective  Reports that the heat is actually messing with her today but her low back is 4/10. Patient reports that she is now able to walk 2 laps before she has to stop due to discomfort.    Pertinent History  Arthritis.    Limitations  Standing    How long can you stand comfortably?  10 minutes    How long can you walk comfortably?  Short community distances.    Patient Stated Goals  Decrease pain and walk better especially in the morning.    Currently in Pain?  Yes    Pain Score  4     Pain Location  Back    Pain Orientation  Mid    Pain Descriptors / Indicators   Discomfort    Pain Type  Chronic pain    Pain Onset  More than a month ago         [facility] PT Assessment - 08/27/18 0001      Assessment   Medical Diagnosis  Spinal stenosis of lumbar region.      Precautions   Precautions  None      Restrictions   Weight Bearing Restrictions  No                   [facility] Adult PT Treatment/Exercise - 08/27/18 0001      Exercises   Exercises  Lumbar      Lumbar Exercises: Aerobic   Nustep  L5 x14min UE/LE activity, monitored      Lumbar Exercises: Machines for Strengthening   Cybex Knee Extension  10# x20 reps    Cybex Knee Flexion  30# 2x10 reps      Lumbar Exercises: Standing   Forward Lunge  15 reps;Other (comment)   4# reachouts BLE   Scapular Retraction  Strengthening;Both;Limitations;Other (comment)    Scapular Retraction Limitations  Pink XTS    Shoulder Extension  Strengthening;Both;20 reps;Theraband    Shoulder Extension Limitations  Pink XTS    Other Standing Lumbar Exercises  B chop wood pink XTS x20 reps    Other Standing Lumbar Exercises  4# reachouts to ceiling x10 reps BLE      Lumbar Exercises: Supine   Bent Knee Raise  20 reps    Bridge  20 reps    Straight Leg Raise  20 reps               PT Short Term Goals - 08/20/18 1345      PT SHORT TERM GOAL #2   Title  Stand 20 minutes with pain not > 3/10.    Time  6    Period  Weeks    Status  Achieved      PT SHORT TERM GOAL #3   Title  Walk a community distance with pain not > 3-4/10.    Time  6    Period  Weeks    Status  Achieved      PT SHORT TERM GOAL #4   Title  Eliminate LE symptoms.    Time  6    Period  Weeks    Status  On-going   Ongoing, to bilateral calves              Plan - 08/27/18 1350    Clinical Impression Statement  Patient tolerated today's treatment well although LBP remains around 4/10. Patient progressed through more advanced core/lumbar strengthening exercises as well as LE strengthening. No complaints  of LBP during the therex session but reported recent R shoulder discomfort. Core activation VCs throughout today's treatment. Patient denied any LBP upon end of treatment.    Rehab Potential  Good    PT Frequency  2x / week    PT Duration  6 weeks    PT Treatment/Interventions  ADLs/Self Care Home Management;Cryotherapy;Electrical Stimulation;Ultrasound;Moist Heat;Therapeutic activities;Therapeutic exercise;Patient/family education;Manual techniques;Dry needling    PT Next Visit Plan  cont with POC for core strengthening and progression per tolerance    Consulted and Agree with Plan of Care  Patient       Patient will benefit from skilled therapeutic intervention in order to improve the following deficits and impairments:  Pain, Decreased activity tolerance  Visit Diagnosis: Chronic bilateral low back pain with sciatica, sciatica laterality unspecified     Problem List Patient Active Problem List   Diagnosis Date Noted  . NSTEMI (non-ST elevated myocardial infarction) (HCC) 12/03/2017    Marvell Fuller, PTA 08/27/2018, 1:56 PM  [facility] [address] [place], [place], [address] Phone: [phone]   Fax:  [phone]  Name: Claudia Lewis MRN: [id number] Date of Birth: [birth year]

## 2018-08-30 ENCOUNTER — Ambulatory Visit: Payer: Medicare HMO | Admitting: *Deleted

## 2018-08-30 DIAGNOSIS — G8929 Other chronic pain: Secondary | ICD-10-CM

## 2018-08-30 DIAGNOSIS — M5440 Lumbago with sciatica, unspecified side: Secondary | ICD-10-CM | POA: Diagnosis not present

## 2018-08-30 NOTE — Therapy (Signed)
[facility] [place], [place], [address] Phone: [phone]   Fax:  [phone]  Physical Therapy Treatment  Patient Details  Name: Claudia Lewis MRN: [id number] Date of Birth: 08/05/1946 Referring Provider (PT): tania Marcy Panning NP   Encounter Date: 08/30/2018  PT End of Session - 08/30/18 0930    Visit Number  9    Number of Visits  12    Date for PT Re-Evaluation  09/12/18    PT Start Time  0905    PT Stop Time  0954    PT Time Calculation (min)  49 min       Past Medical History:  Diagnosis Date  . Anemia    a. noted on 11/2017 labs.  . Arthritis   . Hyperlipidemia   . Hypokalemia   . Hypothyroidism   . NSTEMI (non-ST elevated myocardial infarction) ([place])    a. 11/2017 - cath with normal, tortuous arteries, no culprit, EF normal.    Past Surgical History:  Procedure Laterality Date  . ABDOMINAL HYSTERECTOMY    . LEFT HEART CATH AND CORONARY ANGIOGRAPHY N/A 12/03/2017   Procedure: LEFT HEART CATH AND CORONARY ANGIOGRAPHY;  Surgeon: Lorretta Harp, MD;  Location: [place] [facility];  Service: Cardiovascular;  Laterality: N/A;    There were no vitals filed for this visit.  Subjective Assessment - 08/30/18 0922    Subjective  Doing ok today. I was able to walk 4 laps yesterday for the first time in a long time.    Pertinent History  Arthritis.    Limitations  Standing    How long can you stand comfortably?  10 minutes    How long can you walk comfortably?  Short community distances.    Patient Stated Goals  Decrease pain and walk better especially in the morning.    Currently in Pain?  No/denies    Pain Location  Back    Pain Orientation  Mid                       [facility] Adult PT Treatment/Exercise - 08/30/18 0001      Exercises   Exercises  Lumbar      Lumbar Exercises: Aerobic   Nustep  L5 x32mn UE/LE activity, monitored      Lumbar Exercises: Machines for Strengthening   Cybex Knee Extension  10# x30 reps    Cybex Knee Flexion  30# 3x10 reps      Lumbar Exercises: Standing   Forward Lunge  15 reps;Other (comment)   4# reachouts BLE   Scapular Retraction  Strengthening;Both;Limitations;Other (comment);20 reps   2x20   Scapular Retraction Limitations  Pink XTS    Shoulder Extension  Strengthening;Both;20 reps;Theraband    Theraband Level (Shoulder Extension)  --   Pink XTS   Other Standing Lumbar Exercises  B chop wood pink XTS x20 reps    Other Standing Lumbar Exercises  4# reachouts x10 reps BLE   20 total               PT Short Term Goals - 08/20/18 1345      PT SHORT TERM GOAL #2   Title  Stand 20 minutes with pain not > 3/10.    Time  6    Period  Weeks    Status  Achieved      PT SHORT TERM GOAL #3   Title  Walk a community distance with pain not >  3-4/10.    Time  6    Period  Weeks    Status  Achieved      PT SHORT TERM GOAL #4   Title  Eliminate LE symptoms.    Time  6    Period  Weeks    Status  On-going   Ongoing, to bilateral calves              Plan - 08/30/18 1112    Clinical Impression Statement  Pt arrived today doing fairly well and was able to perform all core act's and therex today without complaints. She reports being able to walk 1 mile yesterday for the first time since starting PT. Pt continues to progress with all exs and has met all LTGs except elimination of LE symptoms. She reports having some cramping in LT quad yesterday, but not today.    Clinical Presentation  Evolving    Rehab Potential  Good    PT Frequency  2x / week    PT Duration  6 weeks    PT Treatment/Interventions  ADLs/Self Care Home Management;Cryotherapy;Electrical Stimulation;Ultrasound;Moist Heat;Therapeutic activities;Therapeutic exercise;Patient/family education;Manual techniques;Dry needling    PT Next Visit Plan  cont with POC for core strengthening and progression per tolerance    Consulted and Agree with Plan of Care   Patient       Patient will benefit from skilled therapeutic intervention in order to improve the following deficits and impairments:  Pain, Decreased activity tolerance  Visit Diagnosis: Chronic bilateral low back pain with sciatica, sciatica laterality unspecified     Problem List Patient Active Problem List   Diagnosis Date Noted  . NSTEMI (non-ST elevated myocardial infarction) ([place]) 12/03/2017    RAMSEUR,CHRIS, PTA 08/30/2018, 11:25 AM  [facility] [address] [place], [place], [address] Phone: [phone]   Fax:  [phone]  Name: Claudia Lewis MRN: [id number] Date of Birth: Aug 20, 1946

## 2018-09-03 ENCOUNTER — Ambulatory Visit: Payer: Medicare HMO | Admitting: Physical Therapy

## 2018-09-03 ENCOUNTER — Encounter: Payer: Self-pay | Admitting: Physical Therapy

## 2018-09-03 DIAGNOSIS — G8929 Other chronic pain: Secondary | ICD-10-CM

## 2018-09-03 DIAGNOSIS — M5440 Lumbago with sciatica, unspecified side: Secondary | ICD-10-CM | POA: Diagnosis not present

## 2018-09-03 NOTE — Therapy (Signed)
[facility] [address] [place], [place], [address] Phone: [phone]   Fax:  [phone]  Physical Therapy Treatment  Patient Details  Name: Claudia Lewis MRN: [id number] Date of Birth: Aug 04, 1946 Referring Provider (PT): tania Earney Mallet NP   Encounter Date: 09/03/2018  PT End of Session - 09/03/18 1317    Visit Number  10    Number of Visits  12    Date for PT Re-Evaluation  09/12/18    PT Start Time  1300    PT Stop Time  1345    PT Time Calculation (min)  45 min    Activity Tolerance  Patient tolerated treatment well    Behavior During Therapy  [facility] for tasks assessed/performed       Past Medical History:  Diagnosis Date  . Anemia    a. noted on 11/2017 labs.  . Arthritis   . Hyperlipidemia   . Hypokalemia   . Hypothyroidism   . NSTEMI (non-ST elevated myocardial infarction) (HCC)    a. 11/2017 - cath with normal, tortuous arteries, no culprit, EF normal.    Past Surgical History:  Procedure Laterality Date  . ABDOMINAL HYSTERECTOMY    . LEFT HEART CATH AND CORONARY ANGIOGRAPHY N/A 12/03/2017   Procedure: LEFT HEART CATH AND CORONARY ANGIOGRAPHY;  Surgeon: Runell Gess, MD;  [facility];  Service: Cardiovascular;  Laterality: N/A;    There were no vitals filed for this visit.  Subjective Assessment - 09/03/18 1316    Subjective  Patient reported she has no pain in her back but her glutes are sore and attributes the soreness to sitting in the car for a couple of hours over the weekend.    Pertinent History  Arthritis.    Limitations  Standing    How long can you stand comfortably?  10 minutes    How long can you walk comfortably?  Short community distances.    Patient Stated Goals  Decrease pain and walk better especially in the morning.    Currently in Pain?  No/denies         [facility] PT Assessment - 09/03/18 0001      Assessment   Medical Diagnosis  Spinal stenosis of lumbar region.       Precautions   Precautions  None      Restrictions   Weight Bearing Restrictions  No                   [facility] Adult PT Treatment/Exercise - 09/03/18 0001      Exercises   Exercises  Lumbar      Lumbar Exercises: Aerobic   Nustep  L5 x48min UE/LE activity, monitored      Lumbar Exercises: Machines for Strengthening   Cybex Knee Extension  10# x30 reps    Cybex Knee Flexion  30# 3x10 reps      Lumbar Exercises: Standing   Heel Raises  20 reps    Scapular Retraction  Strengthening;Both;Limitations;Other (comment);20 reps    Scapular Retraction Limitations  Pink XTS    Shoulder Extension  Strengthening;Both;20 reps;Theraband    Shoulder Extension Limitations  Pink XTS    Other Standing Lumbar Exercises  B chop wood pink XTS x20 reps    Other Standing Lumbar Exercises  4# reachouts x10 reps BLE   20 total      Lumbar Exercises: Seated   Sit to Stand  20 reps    Sit to Stand Limitations  glute taps      Lumbar Exercises: Supine   Clam  20 reps;Limitations;10 reps    Clam Limitations  green theraband    Bridge with [employer]  Compliant;20 reps               PT Short Term Goals - 08/20/18 1345      PT SHORT TERM GOAL #2   Title  Stand 20 minutes with pain not > 3/10.    Time  6    Period  Weeks    Status  Achieved      PT SHORT TERM GOAL #3   Title  Walk a community distance with pain not > 3-4/10.    Time  6    Period  Weeks    Status  Achieved      PT SHORT TERM GOAL #4   Title  Eliminate LE symptoms.    Time  6    Period  Weeks    Status  On-going   Ongoing, to bilateral calves              Plan - 09/03/18 1535    Clinical Impression Statement  Patient was able to tolerate treatment well and was able to demonstrate good form with all exercises. Patient reports she can perform all activities with no reports of pain. Patient and PT discussed upcoming visits and to anticipate discharge at visit 12 pending assessment of goals.   Patient reported understanding    Clinical Presentation  Evolving    Rehab Potential  Good    PT Frequency  2x / week    PT Duration  6 weeks    PT Treatment/Interventions  ADLs/Self Care Home Management;Cryotherapy;Electrical Stimulation;Ultrasound;Moist Heat;Therapeutic activities;Therapeutic exercise;Patient/family education;Manual techniques;Dry needling    PT Next Visit Plan  cont with POC for core strengthening and progression per tolerance    Consulted and Agree with Plan of Care  Patient       Patient will benefit from skilled therapeutic intervention in order to improve the following deficits and impairments:  Pain, Decreased activity tolerance  Visit Diagnosis: Chronic bilateral low back pain with sciatica, sciatica laterality unspecified     Problem List Patient Active Problem List   Diagnosis Date Noted  . NSTEMI (non-ST elevated myocardial infarction) (HCC) 12/03/2017   Guss Bunde, PT, DPT 09/03/2018, 3:46 PM  [facility] [address] [place], [place], [address] Phone: [phone]   Fax:  [phone]  Name: Claudia Lewis MRN: [id number] Date of Birth: 09-Jul-1946

## 2018-09-06 ENCOUNTER — Ambulatory Visit: Payer: Medicare HMO | Admitting: *Deleted

## 2018-09-06 DIAGNOSIS — G8929 Other chronic pain: Secondary | ICD-10-CM

## 2018-09-06 DIAGNOSIS — M5440 Lumbago with sciatica, unspecified side: Principal | ICD-10-CM

## 2018-09-06 NOTE — Therapy (Signed)
[facility] [place], [place], [address] Phone: [phone]   Fax:  [phone]  Physical Therapy Treatment  Patient Details  Name: Claudia Lewis MRN: [id number] Date of Birth: 1946/07/23 Referring Provider (PT): tania Marcy Panning NP   Encounter Date: 09/06/2018  PT End of Session - 09/06/18 0908    Visit Number  11    Number of Visits  12    Date for PT Re-Evaluation  09/12/18    PT Start Time  0900    PT Stop Time  0946    PT Time Calculation (min)  46 min       Past Medical History:  Diagnosis Date  . Anemia    a. noted on 11/2017 labs.  . Arthritis   . Hyperlipidemia   . Hypokalemia   . Hypothyroidism   . NSTEMI (non-ST elevated myocardial infarction) ([place])    a. 11/2017 - cath with normal, tortuous arteries, no culprit, EF normal.    Past Surgical History:  Procedure Laterality Date  . ABDOMINAL HYSTERECTOMY    . LEFT HEART CATH AND CORONARY ANGIOGRAPHY N/A 12/03/2017   Procedure: LEFT HEART CATH AND CORONARY ANGIOGRAPHY;  Surgeon: Lorretta Harp, MD;  Location: [facility];  Service: Cardiovascular;  Laterality: N/A;    There were no vitals filed for this visit.  Subjective Assessment - 09/06/18 0907    Subjective  LBP 5/10 this AM, but usually no pain after sessions    Pertinent History  Arthritis.    Limitations  Standing    How long can you stand comfortably?  10 minutes    How long can you walk comfortably?  Short community distances.    Patient Stated Goals  Decrease pain and walk better especially in the morning.    Currently in Pain?  Yes    Pain Score  5     Pain Location  Back    Pain Descriptors / Indicators  Discomfort    Pain Type  Chronic pain    Pain Onset  More than a month ago    Pain Frequency  Constant                       [facility] Adult PT Treatment/Exercise - 09/06/18 0001      Exercises   Exercises  Lumbar      Lumbar Exercises: Aerobic   UBE  (Upper Arm Bike)  90 RPMs x 5 mins with focus on posture    Nustep  L5-6  x25mn UE/LE activity, monitored      Lumbar Exercises: Machines for Strengthening   Cybex Knee Extension  10# x30 reps    Cybex Knee Flexion  30# 3x10 reps      Lumbar Exercises: Standing   Scapular Retraction  Strengthening;Both;Limitations;Other (comment);20 reps;10 reps    Scapular Retraction Limitations  orange XTS    Shoulder Extension  Strengthening;Both;20 reps;Theraband;10 reps    Shoulder Extension Limitations  pink XTS    Other Standing Lumbar Exercises  B chop wood pink XTS x15 reps    Other Standing Lumbar Exercises  4# reachouts x10 reps BLE   20 total      Lumbar Exercises: Seated   Sit to Stand  20 reps    Sit to Stand Limitations  glute taps               PT Short Term Goals - 08/20/18 1345      PT  SHORT TERM GOAL #2   Title  Stand 20 minutes with pain not > 3/10.    Time  6    Period  Weeks    Status  Achieved      PT SHORT TERM GOAL #3   Title  Walk a community distance with pain not > 3-4/10.    Time  6    Period  Weeks    Status  Achieved      PT SHORT TERM GOAL #4   Title  Eliminate LE symptoms.    Time  6    Period  Weeks    Status  On-going   Ongoing, to bilateral calves              Plan - 09/06/18 0943    Clinical Impression Statement  Pt arrived today doing fairly well and feels that PT has really  with her LBP. She was guided through core exs and instructed in independent set up of equipment for Pt plans on joining our gym program after DC from PT. She has currently Met all goals except elimination of LT LE symptoms. DC after next Rx.    Clinical Presentation  Evolving    PT Frequency  2x / week    PT Duration  6 weeks    PT Treatment/Interventions  ADLs/Self Care Home Management;Cryotherapy;Electrical Stimulation;Ultrasound;Moist Heat;Therapeutic activities;Therapeutic exercise;Patient/family education;Manual techniques;Dry needling    PT Next Visit  Plan  DC to gym program after next Rx    Consulted and Agree with Plan of Care  Patient       Patient will benefit from skilled therapeutic intervention in order to improve the following deficits and impairments:     Visit Diagnosis: Chronic bilateral low back pain with sciatica, sciatica laterality unspecified     Problem List Patient Active Problem List   Diagnosis Date Noted  . NSTEMI (non-ST elevated myocardial infarction) ([place]) 12/03/2017    Brandace Cargle,CHRIS, PTA 09/06/2018, 11:12 AM  [facility] [place], [place], [address] Phone: [phone]   Fax:  [phone]  Name: Claudia Lewis MRN: [id number] Date of Birth: 11-06-1946

## 2018-09-10 ENCOUNTER — Ambulatory Visit: Payer: Medicare HMO | Admitting: *Deleted

## 2018-09-10 DIAGNOSIS — M5440 Lumbago with sciatica, unspecified side: Secondary | ICD-10-CM | POA: Diagnosis not present

## 2018-09-10 DIAGNOSIS — G8929 Other chronic pain: Secondary | ICD-10-CM

## 2018-09-10 NOTE — Therapy (Addendum)
[facility] [place], [place], [address] Phone: [phone]   Fax:  [phone]  Physical Therapy Treatment  Patient Details  Name: Claudia Lewis MRN: [id number] Date of Birth: [birth year] Referring Provider (PT): tania Marcy Panning NP   Encounter Date: 09/10/2018  PT End of Session - 09/10/18 0831    Visit Number  12    Number of Visits  12    Date for PT Re-Evaluation  09/12/18    PT Start Time  0815    PT Stop Time  0905    PT Time Calculation (min)  50 min       Past Medical History:  Diagnosis Date  . Anemia    a. noted on 11/2017 labs.  . Arthritis   . Hyperlipidemia   . Hypokalemia   . Hypothyroidism   . NSTEMI (non-ST elevated myocardial infarction) ([place])    a. 11/2017 - cath with normal, tortuous arteries, no culprit, EF normal.    Past Surgical History:  Procedure Laterality Date  . ABDOMINAL HYSTERECTOMY    . LEFT HEART CATH AND CORONARY ANGIOGRAPHY N/A 12/03/2017   Procedure: LEFT HEART CATH AND CORONARY ANGIOGRAPHY;  Surgeon: Lorretta Harp, MD;  Location: [facility];  Service: Cardiovascular;  Laterality: N/A;    There were no vitals filed for this visit.  Subjective Assessment - 09/10/18 0829    Subjective  No LBP this AM.    Pertinent History  Arthritis.    Limitations  Standing    How long can you stand comfortably?  10 minutes    How long can you walk comfortably?  Short community distances.    Patient Stated Goals  Decrease pain and walk better especially in the morning.    Currently in Pain?  Yes    Pain Location  Back    Pain Orientation  Mid    Pain Descriptors / Indicators  Discomfort    Pain Type  Chronic pain    Pain Onset  More than a month ago    Pain Frequency  Constant                       [facility] Adult PT Treatment/Exercise - 09/10/18 0001      Exercises   Exercises  Lumbar      Lumbar Exercises: Aerobic   UBE (Upper Arm Bike)  90 RPMs x 3  mins with focus on posture    Nustep  L5-6  x55mn UE/LE activity, monitored      Lumbar Exercises: Machines for Strengthening   Cybex Knee Extension  10# x30 reps    Cybex Knee Flexion  40# 3x10 reps      Lumbar Exercises: Standing   Scapular Retraction  Strengthening;Both;Limitations;Other (comment);20 reps;10 reps    Scapular Retraction Limitations  orange XTS    Shoulder Extension  Strengthening;Both;20 reps;Theraband;10 reps   pink XTS   Other Standing Lumbar Exercises  B chop wood pink XTS x15 reps    Other Standing Lumbar Exercises  --      bike x 3 mins L4         PT Short Term Goals - 09/10/18 0835      PT SHORT TERM GOAL #1   Title  STG's=LTG's.      PT SHORT TERM GOAL #2   Time  6    Status  Achieved      PT SHORT TERM GOAL #3   Title  Walk a community distance with pain not > 3-4/10.    Time  6    Period  Weeks    Status  Achieved      PT SHORT TERM GOAL #4   Title  Eliminate LE symptoms.    Time  6    Period  Weeks    Status  Partially Met   50% better              Plan - 09/10/18 0768    Clinical Impression Statement  Pt arrived today doing very well with no LBP and reports LE symptoms were intermittent and probaly 50% better overall. Pt has met all LTGs except eliminate LE symptoms and this was partially met. Pt was guided through [place] today and was able to independently set up gym machines for DC to gym program to continue progression. Pt did well with Rx today. DC to Gym program    Clinical Presentation  Evolving    Rehab Potential  Good    PT Frequency  2x / week    PT Duration  6 weeks    PT Treatment/Interventions  ADLs/Self Care Home Management;Cryotherapy;Electrical Stimulation;Ultrasound;Moist Heat;Therapeutic activities;Therapeutic exercise;Patient/family education;Manual techniques;Dry needling    PT Next Visit Plan  DC to gym program    Consulted and Agree with Plan of Care  Patient       Patient will benefit from  skilled therapeutic intervention in order to improve the following deficits and impairments:  Pain, Decreased activity tolerance  Visit Diagnosis: Chronic bilateral low back pain with sciatica, sciatica laterality unspecified     Problem List Patient Active Problem List   Diagnosis Date Noted  . NSTEMI (non-ST elevated myocardial infarction) ([place]) 12/03/2017    Shadasia Oldfield,CHRIS, PTA 09/10/2018, 9:12 AM  [facility] [address] [place], [place], [address] Phone: [phone]   Fax:  [phone]  Name: Claudia Lewis MRN: [id number] Date of Birth: [birth year]  PHYSICAL THERAPY DISCHARGE SUMMARY  Visits from Start of Care: 12.  Current functional level related to goals / functional outcomes: See above.   Remaining deficits: Very good progress toward goals.  See goal section.     Education / Equipment: HEP. Plan: Patient agrees to discharge.  Patient goals were partially met. Patient is being discharged due to being pleased with the current functional level.  ?????         Mali Applegate MPT

## 2019-03-04 ENCOUNTER — Other Ambulatory Visit: Payer: Self-pay | Admitting: Physician Assistant

## 2019-04-17 ENCOUNTER — Telehealth: Payer: Self-pay | Admitting: Cardiology

## 2019-04-17 NOTE — Telephone Encounter (Signed)
Virtual Visit Pre-Appointment Phone Call  "(Name), I am calling you today to discuss your upcoming appointment. We are currently trying to limit exposure to the virus that causes COVID-19 by seeing patients at home rather than in the office."  1. "What is the BEST phone number to call the day of the visit?" - include this in appointment notes  2. Do you have or have access to (through a family member/friend) a smartphone with video capability that we can use for your visit?" a. If yes - list this number in appt notes as cell (if different from BEST phone #) and list the appointment type as a VIDEO visit in appointment notes b. If no - list the appointment type as a PHONE visit in appointment notes  3. Confirm consent - "In the setting of the current Covid19 crisis, you are scheduled for a (phone or video) visit with your provider on (date) at (time).  Just as we do with many in-office visits, in order for you to participate in this visit, we must obtain consent.  If you'd like, I can send this to your mychart (if signed up) or email for you to review.  Otherwise, I can obtain your verbal consent now.  All virtual visits are billed to your insurance company just like a normal visit would be.  By agreeing to a virtual visit, we'd like you to understand that the technology does not allow for your provider to perform an examination, and thus may limit your provider's ability to fully assess your condition. If your provider identifies any concerns that need to be evaluated in person, we will make arrangements to do so.  Finally, though the technology is pretty good, we cannot assure that it will always work on either your or our end, and in the setting of a video visit, we may have to convert it to a phone-only visit.  In either situation, we cannot ensure that we have a secure connection.  Are you willing to proceed?" STAFF: Did the patient verbally acknowledge consent to telehealth visit? Document  YES/NO here: YES  4. Advise patient to be prepared - "Two hours prior to your appointment, go ahead and check your blood pressure, pulse, oxygen saturation, and your weight (if you have the equipment to check those) and write them all down. When your visit starts, your provider will ask you for this information. If you have an Apple Watch or Kardia device, please plan to have heart rate information ready on the day of your appointment. Please have a pen and paper handy nearby the day of the visit as well."  5. Give patient instructions for MyChart download to smartphone OR Doximity/Doxy.me as below if video visit (depending on what platform provider is using)  6. Inform patient they will receive a phone call 15 minutes prior to their appointment time (may be from unknown caller ID) so they should be prepared to answer    TELEPHONE CALL NOTE  Claudia Lewis has been deemed a candidate for a follow-up tele-health visit to limit community exposure during the Covid-19 pandemic. I spoke with the patient via phone to ensure availability of phone/video source, confirm preferred email & phone number, and discuss instructions and expectations.  I reminded Claudia Lewis to be prepared with any vital sign and/or heart rhythm information that could potentially be obtained via home monitoring, at the time of her visit. I reminded Claudia Lewis to expect a phone call prior to  her visit.  Claudia ContrasStephanie R Smith 04/17/2019 2:26 PM   INSTRUCTIONS FOR DOWNLOADING THE MYCHART APP TO SMARTPHONE  - The patient must first make sure to have activated MyChart and know their login information - If Apple, go to [employer] Store and type in MyChart in the search bar and download the app. If Android, ask patient to go to [employer] Play Store and type in RetsofMyChart in the search bar and download the app. The app is free but as with any other app downloads, their phone may require them to verify saved payment information or Apple/Android  password.  - The patient will need to then log into the app with their MyChart username and password, and select [facility] as their healthcare provider to link the account. When it is time for your visit, go to the MyChart app, find appointments, and click Begin Video Visit. Be sure to Select Allow for your device to access the Microphone and Camera for your visit. You will then be connected, and your provider will be with you shortly.  **If they have any issues connecting, or need assistance please contact MyChart service desk [phone])**  **If using a computer, in order to ensure the best quality for their visit they will need to use either of the following Internet Browsers: [employer] Edge, or Google Chrome**  IF USING DOXIMITY or DOXY.ME - The patient will receive a link just prior to their visit by text.     FULL LENGTH CONSENT FOR TELE-HEALTH VISIT   I hereby voluntarily request, consent and authorize [facility] and its employed or contracted physicians, physician assistants, nurse practitioners or other licensed health care professionals (the Practitioner), to provide me with telemedicine health care services (the Services") as deemed necessary by the treating Practitioner. I acknowledge and consent to receive the Services by the Practitioner via telemedicine. I understand that the telemedicine visit will involve communicating with the Practitioner through live audiovisual communication technology and the disclosure of certain medical information by electronic transmission. I acknowledge that I have been given the opportunity to request an in-person assessment or other available alternative prior to the telemedicine visit and am voluntarily participating in the telemedicine visit.  I understand that I have the right to withhold or withdraw my consent to the use of telemedicine in the course of my care at any time, without affecting my right to future care or treatment,  and that the Practitioner or I may terminate the telemedicine visit at any time. I understand that I have the right to inspect all information obtained and/or recorded in the course of the telemedicine visit and may receive copies of available information for a reasonable fee.  I understand that some of the potential risks of receiving the Services via telemedicine include:   Delay or interruption in medical evaluation due to technological equipment failure or disruption;  Information transmitted may not be sufficient (e.g. poor resolution of images) to allow for appropriate medical decision making by the Practitioner; and/or   In rare instances, security protocols could fail, causing a breach of personal health information.  Furthermore, I acknowledge that it is my responsibility to provide information about my medical history, conditions and care that is complete and accurate to the best of my ability. I acknowledge that Practitioner's advice, recommendations, and/or decision may be based on factors not within their control, such as incomplete or inaccurate data provided by me or distortions of diagnostic images or specimens that may result from electronic transmissions. I  understand that the practice of medicine is not an exact science and that Practitioner makes no warranties or guarantees regarding treatment outcomes. I acknowledge that I will receive a copy of this consent concurrently upon execution via email to the email address I last provided but may also request a printed copy by calling the office of [place].    I understand that my insurance will be billed for this visit.   I have read or had this consent read to me.  I understand the contents of this consent, which adequately explains the benefits and risks of the Services being provided via telemedicine.   I have been provided ample opportunity to ask questions regarding this consent and the Services and have had my questions  answered to my satisfaction.  I give my informed consent for the services to be provided through the use of telemedicine in my medical care  By participating in this telemedicine visit I agree to the above.

## 2019-04-21 NOTE — Progress Notes (Signed)
Virtual Visit via Telephone Note   This visit type was conducted due to national recommendations for restrictions regarding the COVID-19 Pandemic (e.g. social distancing) in an effort to limit this patient's exposure and mitigate transmission in our community.  Due to her co-morbid illnesses, this patient is at least at moderate risk for complications without adequate follow up.  This format is felt to be most appropriate for this patient at this time.  The patient did not have access to video technology/had technical difficulties with video requiring transitioning to audio format only (telephone).  All issues noted in this document were discussed and addressed.  No physical exam could be performed with this format.  Please refer to the patient's chart for her  consent to telehealth for [facility].   Date:  04/22/2019   ID:  Claudia, Lewis Sep 10, 1946, MRN [id number]  Patient Location: Home Provider Location: Home  PCP:  Jeanice Lim, PA-C  Cardiologist:  Nona Dell, MD  Evaluation Performed:  Follow-Up Visit  Chief Complaint:  Cardiac follow-up  History of Present Illness:    Claudia Lewis is a 73 y.o. female last seen in April 2019.  She did not have video access and we spoke by phone today.  States that she has done well, no chest pain or shortness of breath, no palpitations or syncope.  She continues on the medications listed below including aspirin and low-dose Imdur.  She follows lab work and health maintenance with PCP.  The patient does not have symptoms concerning for COVID-19 infection (fever, chills, cough, or new shortness of breath).    Past Medical History:  Diagnosis Date  . Anemia    a. noted on 11/2017 labs.  . Arthritis   . Hyperlipidemia   . Hypokalemia   . Hypothyroidism   . NSTEMI (non-ST elevated myocardial infarction) (HCC)    a. 11/2017 - cath with normal, tortuous arteries, no culprit, EF normal.   Past Surgical History:   Procedure Laterality Date  . ABDOMINAL HYSTERECTOMY    . LEFT HEART CATH AND CORONARY ANGIOGRAPHY N/A 12/03/2017   Procedure: LEFT HEART CATH AND CORONARY ANGIOGRAPHY;  Surgeon: Runell Gess, MD;  [facility];  Service: Cardiovascular;  Laterality: N/A;     Current Meds  Medication Sig  . acetaminophen (TYLENOL) 500 MG tablet Take 500 mg by mouth every 6 (six) hours as needed for mild pain or moderate pain.   Marland Kitchen aspirin EC 81 MG tablet Take 81 mg by mouth daily.  . cholecalciferol (VITAMIN D) 1000 units tablet Take 1,000 Units by mouth daily.   . folic acid (FOLVITE) 1 MG tablet Take 1 tablet by mouth daily.  . isosorbide mononitrate (IMDUR) 30 MG 24 hr tablet Take 1/2 (one-half) tablet by mouth once daily  . levothyroxine (SYNTHROID, LEVOTHROID) 25 MCG tablet Take 25 mcg by mouth daily.  . methotrexate (RHEUMATREX) 2.5 MG tablet Take 15 mg by mouth every Sunday. 3 tabs twice daily on Sundays only  . pantoprazole (PROTONIX) 20 MG tablet Take 1 tablet by mouth daily.     Allergies:   Patient has no known allergies.   Social History   Tobacco Use  . Smoking status: Former Smoker    Last attempt to quit: 12/03/1988    Years since quitting: 30.4  . Smokeless tobacco: Never Used  Substance Use Topics  . Alcohol use: No  . Drug use: No     Family Hx: The patient's family history  includes Hypertension in her mother.  ROS:   Please see the history of present illness.    Reported "disc problem." All other systems reviewed and are negative.   Prior CV studies:   The following studies were reviewed today:  Cardiac catheterization 12/03/2017:  The left ventricular systolic function is normal.  LV end diastolic pressure is normal.  The left ventricular ejection fraction is 55-65% by visual estimate.  IMPRESSION:Ms. Claudia Lewis has essentially normal coronary arteries and normal LV function. Her arteries are somewhat tortuous system with hypertensive heart disease."  Lesions. Medical therapy will be recommended. The sheath was removed and a TR band was placed on the right wrist which is patent hemostasis. The patient left the lab in stable condition. She can be discharged later today and followed up closely as an outpatient.  Labs/Other Tests and Data Reviewed:    EKG:  An ECG dated 12/03/2017 was personally reviewed today and demonstrated:  Normal sinus rhythm.  Recent Labs: 07/03/2018: ALT 27; BUN 11; Creatinine, Ser 0.70; Hemoglobin 12.6; Platelets 342; Potassium 3.6; Sodium 142    Wt Readings from Last 3 Encounters:  04/22/19 200 lb (90.7 kg)  07/03/18 197 lb (89.4 kg)  03/26/18 196 lb (88.9 kg)     Objective:    Vital Signs:  BP 139/84   Pulse 78   Ht 5\' 6"  (1.676 m)   Wt 200 lb (90.7 kg)   BMI 32.28 kg/m    Patient spoke in full sentences on the phone, not short of breath. No audible wheezing. Speech pattern normal.  ASSESSMENT & PLAN:    1.  Prior history of NSTEMI in January 2019.  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  Plan is to continue observation on aspirin and low-dose Imdur.  She is asymptomatic at this time.  2.  History of hyperlipidemia, managed by diet as per PCP.  COVID-19 Education: The signs and symptoms of COVID-19 were discussed with the patient and how to seek care for testing (follow up with PCP or arrange E-visit).  The importance of social distancing was discussed today.  Time:   Today, I have spent 5 minutes with the patient with telehealth technology discussing the above problems.     Medication Adjustments/Labs and Tests Ordered: Current medicines are reviewed at length with the patient today.  Concerns regarding medicines are outlined above.   Tests Ordered: No orders of the defined types were placed in this encounter.   Medication Changes: No orders of the defined types were placed in this encounter.   Disposition:  Follow up 1 year in the  [place] office.  Signed, Nona DellSamuel Ayodele Hartsock, MD  04/22/2019 2:09 PM    [facility]

## 2019-04-22 ENCOUNTER — Telehealth (INDEPENDENT_AMBULATORY_CARE_PROVIDER_SITE_OTHER): Payer: Medicare HMO | Admitting: Cardiology

## 2019-04-22 ENCOUNTER — Encounter: Payer: Self-pay | Admitting: Cardiology

## 2019-04-22 VITALS — BP 139/84 | HR 78 | Ht 66.0 in | Wt 200.0 lb

## 2019-04-22 DIAGNOSIS — Z7189 Other specified counseling: Secondary | ICD-10-CM

## 2019-04-22 DIAGNOSIS — I252 Old myocardial infarction: Secondary | ICD-10-CM | POA: Diagnosis not present

## 2019-04-22 DIAGNOSIS — E782 Mixed hyperlipidemia: Secondary | ICD-10-CM | POA: Diagnosis not present

## 2019-04-22 NOTE — Patient Instructions (Signed)
Medication Instructions:  Your physician recommends that you continue on your current medications as directed. Please refer to the Current Medication list given to you today.  Labwork: None today  Procedures/Testing: None today  Follow-Up: 1 year with Dr.McDowell  Any Additional Special Instructions Will Be Listed Below (If Applicable).     If you need a refill on your cardiac medications before your next appointment, please call your pharmacy.    Thank you for choosing [facility] !        

## 2019-06-16 ENCOUNTER — Telehealth: Payer: Self-pay | Admitting: Obstetrics and Gynecology

## 2019-06-16 NOTE — Telephone Encounter (Signed)

## 2019-06-17 ENCOUNTER — Other Ambulatory Visit: Payer: Self-pay

## 2019-06-17 ENCOUNTER — Ambulatory Visit (INDEPENDENT_AMBULATORY_CARE_PROVIDER_SITE_OTHER): Payer: Medicare HMO | Admitting: Obstetrics and Gynecology

## 2019-06-17 ENCOUNTER — Encounter: Payer: Self-pay | Admitting: Obstetrics and Gynecology

## 2019-06-17 VITALS — BP 113/65 | HR 70 | Ht 66.0 in | Wt 197.6 lb

## 2019-06-17 DIAGNOSIS — F520 Hypoactive sexual desire disorder: Secondary | ICD-10-CM | POA: Diagnosis not present

## 2019-06-17 NOTE — Progress Notes (Signed)
Patient ID: ZIYON CEDOTAL, female   DOB: 10-10-1946, 73 y.o.   MRN: [id number]   [facility] Clinic Visit  @DATE @            Patient name: ESME FREUND MRN [name]  Date of birth: 13-Jan-1946  CC & HPI:  THEIA DEZEEUW is a 73 y.o. female presenting today for decreased libido. Had heart attack in 2019. Husband 22  is sexually active and wants to have sex often but she no longer is interested. They have sex about once a week but doesn't like feeling this way. Has had hysterectomy, no vaginal dryness or pain. Noticed decrease in libido after having her heart attack.  ROS:  ROS +decreased libido -dyspareunia  Pertinent History Reviewed:   Reviewed: Medical         Past Medical History:  Diagnosis Date  . Anemia    a. noted on 11/2017 labs.  . Arthritis   . Hyperlipidemia   . Hypokalemia   . Hypothyroidism   . NSTEMI (non-ST elevated myocardial infarction) (HCC)    a. 11/2017 - cath with normal, tortuous arteries, no culprit, EF normal.                              Surgical Hx:    Past Surgical History:  Procedure Laterality Date  . ABDOMINAL HYSTERECTOMY    . LEFT HEART CATH AND CORONARY ANGIOGRAPHY N/A 12/03/2017   Procedure: LEFT HEART CATH AND CORONARY ANGIOGRAPHY;  Surgeon: 01/31/2018, MD;  Location: [facility];  Service: Cardiovascular;  Laterality: N/A;   Medications: Reviewed & Updated - see associated section                       Current Outpatient Medications:  .  acetaminophen (TYLENOL) 500 MG tablet, Take 500 mg by mouth every 6 (six) hours as needed for mild pain or moderate pain. , Disp: , Rfl:  .  aspirin EC 81 MG tablet, Take 81 mg by mouth daily., Disp: , Rfl:  .  cholecalciferol (VITAMIN D) 1000 units tablet, Take 1,000 Units by mouth daily. , Disp: , Rfl:  .  folic acid (FOLVITE) 1 MG tablet, Take 1 tablet by mouth daily., Disp: , Rfl: 11 .  isosorbide mononitrate (IMDUR) 30 MG 24 hr tablet, Take 1/2 (one-half) tablet by mouth once daily,  Disp: 45 tablet, Rfl: 1 .  levothyroxine (SYNTHROID, LEVOTHROID) 25 MCG tablet, Take 25 mcg by mouth daily., Disp: , Rfl: 2 .  methotrexate (RHEUMATREX) 2.5 MG tablet, Take 15 mg by mouth every Sunday. 3 tabs twice daily on Sundays only, Disp: , Rfl:  .  pantoprazole (PROTONIX) 20 MG tablet, Take 1 tablet by mouth daily., Disp: , Rfl: 1   Social History: Reviewed -  reports that she quit smoking about 30 years ago. She has never used smokeless tobacco.  Objective Findings:  Vitals: Blood pressure 113/65, pulse 70, height 5\' 6"  (1.676 m), weight 197 lb 9.6 oz (89.6 kg).  PHYSICAL EXAMINATION General appearance - alert, well appearing, and in no distress Mental status - alert, oriented to person, place, and time, normal mood, behavior, speech, dress, motor activity, and thought processes, affect appropriate to mood  PELVIC Discussion only  Assessment & Plan:   A:  1.  Decreased libido 2. Status post heart attack on nitrates  P:  1.  Consult Dr. 03-11-1974 on premarin cream  By signing my name below, I, Samul Dada, attest that this documentation has been prepared under the direction and in the presence of Jonnie Kind, MD. Electronically Signed: [place]. 06/17/19. 3:42 PM.  I personally performed the services described in this documentation, which was SCRIBED in my presence. The recorded information has been reviewed and considered accurate. It has been edited as necessary during review. Jonnie Kind, MD

## 2019-09-08 ENCOUNTER — Other Ambulatory Visit: Payer: Self-pay | Admitting: Cardiology

## 2019-12-12 ENCOUNTER — Other Ambulatory Visit: Payer: Self-pay | Admitting: Cardiology

## 2019-12-31 DIAGNOSIS — M5412 Radiculopathy, cervical region: Secondary | ICD-10-CM | POA: Insufficient documentation

## 2019-12-31 DIAGNOSIS — G8929 Other chronic pain: Secondary | ICD-10-CM | POA: Insufficient documentation

## 2020-01-06 ENCOUNTER — Encounter: Payer: Self-pay | Admitting: Cardiology

## 2020-01-06 NOTE — Telephone Encounter (Signed)
error 

## 2020-01-27 ENCOUNTER — Ambulatory Visit: Payer: Medicare Other | Attending: Internal Medicine

## 2020-01-27 DIAGNOSIS — Z23 Encounter for immunization: Secondary | ICD-10-CM

## 2020-01-27 NOTE — Progress Notes (Signed)
   Covid-19 Vaccination Clinic  Name:  Claudia Lewis    MRN: [id number] DOB: 1946-02-14  01/27/2020  Claudia Lewis was observed post Covid-19 immunization for 15 minutes without incident. She was provided with Vaccine Information Sheet and instruction to access the V-Safe system.   Claudia Lewis was instructed to call 911 with any severe reactions post vaccine: Marland Kitchen Difficulty breathing  . Swelling of face and throat  . A fast heartbeat  . A bad rash all over body  . Dizziness and weakness   Immunizations Administered    Name Date Dose VIS Date Route   Moderna COVID-19 Vaccine 01/27/2020  2:55 PM 0.5 mL 10/28/2019 Intramuscular   Manufacturer: Moderna   Lot: [id number]   NDC: 88757-972-82

## 2020-02-24 ENCOUNTER — Ambulatory Visit: Payer: Medicare Other | Attending: Internal Medicine

## 2020-02-24 DIAGNOSIS — Z23 Encounter for immunization: Secondary | ICD-10-CM

## 2020-02-24 NOTE — Progress Notes (Signed)
   Covid-19 Vaccination Clinic  Name:  ANALIZA COWGER    MRN: [id number] DOB: Sep 19, 1946  02/24/2020  Ms. Molchan was observed post Covid-19 immunization for 15 minutes without incident. She was provided with Vaccine Information Sheet and instruction to access the V-Safe system.   Ms. Cuadros was instructed to call 911 with any severe reactions post vaccine: Marland Kitchen Difficulty breathing  . Swelling of face and throat  . A fast heartbeat  . A bad rash all over body  . Dizziness and weakness   Immunizations Administered    Name Date Dose VIS Date Route   Moderna COVID-19 Vaccine 02/24/2020  1:37 PM 0.5 mL 10/28/2019 Intramuscular   Manufacturer: Moderna   Lot: [id number]   NDC: 42998-069-99

## 2020-04-06 ENCOUNTER — Telehealth: Payer: Self-pay | Admitting: Cardiology

## 2020-04-06 NOTE — Telephone Encounter (Signed)
°  Patient Consent for Virtual Visit         Claudia Lewis has provided verbal consent on 04/06/2020 for a virtual visit (video or telephone).   CONSENT FOR VIRTUAL VISIT FOR:  Claudia Lewis  By participating in this virtual visit I agree to the following:  I hereby voluntarily request, consent and authorize [facility] and its employed or contracted physicians, physician assistants, nurse practitioners or other licensed health care professionals (the Practitioner), to provide me with telemedicine health care services (the Services") as deemed necessary by the treating Practitioner. I acknowledge and consent to receive the Services by the Practitioner via telemedicine. I understand that the telemedicine visit will involve communicating with the Practitioner through live audiovisual communication technology and the disclosure of certain medical information by electronic transmission. I acknowledge that I have been given the opportunity to request an in-person assessment or other available alternative prior to the telemedicine visit and am voluntarily participating in the telemedicine visit.  I understand that I have the right to withhold or withdraw my consent to the use of telemedicine in the course of my care at any time, without affecting my right to future care or treatment, and that the Practitioner or I may terminate the telemedicine visit at any time. I understand that I have the right to inspect all information obtained and/or recorded in the course of the telemedicine visit and may receive copies of available information for a reasonable fee.  I understand that some of the potential risks of receiving the Services via telemedicine include:   Delay or interruption in medical evaluation due to technological equipment failure or disruption;  Information transmitted may not be sufficient (e.g. poor resolution of images) to allow for appropriate medical decision making by the Practitioner;  and/or   In rare instances, security protocols could fail, causing a breach of personal health information.  Furthermore, I acknowledge that it is my responsibility to provide information about my medical history, conditions and care that is complete and accurate to the best of my ability. I acknowledge that Practitioner's advice, recommendations, and/or decision may be based on factors not within their control, such as incomplete or inaccurate data provided by me or distortions of diagnostic images or specimens that may result from electronic transmissions. I understand that the practice of medicine is not an exact science and that Practitioner makes no warranties or guarantees regarding treatment outcomes. I acknowledge that a copy of this consent can be made available to me via my patient portal [facility] MyChart), or I can request a printed copy by calling the office of [facility].    I understand that my insurance will be billed for this visit.   I have read or had this consent read to me.  I understand the contents of this consent, which adequately explains the benefits and risks of the Services being provided via telemedicine.   I have been provided ample opportunity to ask questions regarding this consent and the Services and have had my questions answered to my satisfaction.  I give my informed consent for the services to be provided through the use of telemedicine in my medical care

## 2020-04-08 NOTE — Progress Notes (Signed)
Virtual Visit via Telephone Note   This visit type was conducted due to national recommendations for restrictions regarding the COVID-19 Pandemic (e.g. social distancing) in an effort to limit this patient's exposure and mitigate transmission in our community.  Due to her co-morbid illnesses, this patient is at least at moderate risk for complications without adequate follow up.  This format is felt to be most appropriate for this patient at this time.  The patient did not have access to video technology/had technical difficulties with video requiring transitioning to audio format only (telephone).  All issues noted in this document were discussed and addressed.  No physical exam could be performed with this format.  Please refer to the patient's chart for her  consent to telehealth for [facility].   The patient was identified using 2 identifiers.  Date:  04/09/2020   ID:  Claudia Lewis, DOB 1946-09-19, MRN [id number]  Patient Location: Home Provider Location: Home  PCP:  Jeanice Lim, PA-C  Cardiologist:  Nona Dell, MD Electrophysiologist:  None   Evaluation Performed:  Follow-Up Visit  Chief Complaint:   Cardiac follow-up  History of Present Illness:    Claudia Lewis is a 74 y.o. female last assessed via telehealth encounter in May 2020.  We spoke by phone today.  She does not report any recurring exertional chest pain or unusual breathlessness with typical ADLs.  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  She is troubled by chronic arthritic pain.  I reviewed her medications.  Cardiac regimen includes aspirin and low-dose Imdur.  She states that she has had the coronavirus vaccine.  Past Medical History:  Diagnosis Date  . Anemia    a. noted on 11/2017 labs.  . Arthritis   . Hyperlipidemia   . Hypokalemia   . Hypothyroidism   . NSTEMI (non-ST  elevated myocardial infarction) (HCC)    a. 11/2017 - cath with normal, tortuous arteries, no culprit, EF normal.   Past Surgical History:  Procedure Laterality Date  . ABDOMINAL HYSTERECTOMY    . LEFT HEART CATH AND CORONARY ANGIOGRAPHY N/A 12/03/2017   Procedure: LEFT HEART CATH AND CORONARY ANGIOGRAPHY;  Surgeon: Runell Gess, MD;  [facility];  Service: Cardiovascular;  Laterality: N/A;     Current Meds  Medication Sig  . acetaminophen (TYLENOL) 500 MG tablet Take 500 mg by mouth every 6 (six) hours as needed for mild pain or moderate pain.   Marland Kitchen aspirin EC 81 MG tablet Take 81 mg by mouth daily.  . cholecalciferol (VITAMIN D) 1000 units tablet Take 1,000 Units by mouth daily.   . DULoxetine (CYMBALTA) 30 MG capsule Take 1 capsule by mouth once daily  . folic acid (FOLVITE) 1 MG tablet Take 1 tablet by mouth daily.  . isosorbide mononitrate (IMDUR) 30 MG 24 hr tablet Take 1/2 (one-half) tablet by mouth once daily  . levothyroxine (SYNTHROID, LEVOTHROID) 25 MCG tablet Take 25 mcg by mouth daily.  . methotrexate (RHEUMATREX) 2.5 MG tablet Take 15 mg by mouth every Sunday. 3 tabs twice daily on Sundays only  . pantoprazole (PROTONIX) 20 MG tablet Take 1 tablet by mouth daily.     Allergies:   Patient has no known allergies.   ROS:   Chronic arthritic pain  Prior CV studies:   The following studies were reviewed today:  Cardiac catheterization  12/03/2017:  The left ventricular systolic function is normal.  LV end diastolic pressure is normal.  The left ventricular ejection fraction is 55-65% by visual estimate.  IMPRESSION:Ms. Jaspers has essentially normal coronary arteries and normal LV function. Her arteries are somewhat tortuous system with hypertensive heart disease." Lesions. Medical therapy will be recommended. The sheath was removed and a TR band was placed on the right wrist which is patent hemostasis. The patient left the lab in stable condition. She can  be discharged later today and followed up closely as an outpatient.  Labs/Other Tests and Data Reviewed:    EKG:  An ECG dated 12/03/2017 was personally reviewed today and demonstrated:  Normal sinus rhythm.  Recent Labs:  07/03/2018: ALT 27; BUN 11; Creatinine, Ser 0.70; Hemoglobin 12.6; Platelets 342; Potassium 3.6; Sodium 142   Wt Readings from Last 3 Encounters:  04/09/20 202 lb (91.6 kg)  06/17/19 197 lb 9.6 oz (89.6 kg)  04/22/19 200 lb (90.7 kg)     Objective:    Vital Signs:  BP 127/88   Pulse 88   Ht 5\' 6"  (1.676 m)   Wt 202 lb (91.6 kg)   BMI 32.60 kg/m    Patient spoke in full sentences, not short of breath. No audible wheezing or coughing.  ASSESSMENT & PLAN:    1.  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  She is on low-dose aspirin and Imdur.  Follow-up ECG for next office visit.  2.  Diet managed hyperlipidemia with follow-up by PCP.  Time:   Today, I have spent 5 minutes with the patient with telehealth technology discussing the above problems.     Medication Adjustments/Labs and Tests Ordered: Current medicines are reviewed at length with the patient today.  Concerns regarding medicines are outlined above.   Tests Ordered: No orders of the defined types were placed in this encounter.   Medication Changes: No orders of the defined types were placed in this encounter.   Follow Up:  In Person 1 year in the [place] office.  Signed, Rozann Lesches, MD  04/09/2020 8:48 AM    [place]

## 2020-04-09 ENCOUNTER — Encounter: Payer: Self-pay | Admitting: Cardiology

## 2020-04-09 ENCOUNTER — Telehealth (INDEPENDENT_AMBULATORY_CARE_PROVIDER_SITE_OTHER): Payer: Medicare Other | Admitting: Cardiology

## 2020-04-09 VITALS — BP 127/88 | HR 88 | Ht 66.0 in | Wt 202.0 lb

## 2020-04-09 DIAGNOSIS — E782 Mixed hyperlipidemia: Secondary | ICD-10-CM

## 2020-04-09 DIAGNOSIS — I252 Old myocardial infarction: Secondary | ICD-10-CM

## 2020-04-09 DIAGNOSIS — I201 Angina pectoris with documented spasm: Secondary | ICD-10-CM

## 2020-04-09 NOTE — Patient Instructions (Signed)
Medication Instructions:  Your physician recommends that you continue on your current medications as directed. Please refer to the Current Medication list given to you today.  *If you need a refill on your cardiac medications before your next appointment, please call your pharmacy*   Lab Work: None today If you have labs (blood work) drawn today and your tests are completely normal, you will receive your results only by: Marland Kitchen MyChart Message (if you have MyChart) OR . A paper copy in the mail If you have any lab test that is abnormal or we need to change your treatment, we will call you to review the results.   Testing/Procedures: None today    Follow-Up: At [facility], you and your health needs are our priority.  As part of our continuing mission to provide you with exceptional heart care, we have created designated Provider Care Teams.  These Care Teams include your primary Cardiologist (physician) and Advanced Practice Providers (APPs -  Physician Assistants and Nurse Practitioners) who all work together to provide you with the care you need, when you need it.  We recommend signing up for the patient portal called "MyChart".  Sign up information is provided on this After Visit Summary.  MyChart is used to connect with patients for Virtual Visits (Telemedicine).  Patients are able to view lab/test results, encounter notes, upcoming appointments, etc.  Non-urgent messages can be sent to your provider as well.   To learn more about what you can do with MyChart, go to [email or web address].    Your next appointment:   12 month(s)  The format for your next appointment:   In Person  Provider:   Nona Dell, MD or Randall An, PA-C   Other Instructions None today       Thank you for choosing [facility] !

## 2020-12-06 ENCOUNTER — Other Ambulatory Visit: Payer: Self-pay | Admitting: Cardiology

## 2020-12-17 ENCOUNTER — Other Ambulatory Visit: Payer: Self-pay

## 2020-12-17 MED ORDER — ISOSORBIDE MONONITRATE ER 30 MG PO TB24: ORAL_TABLET | ORAL | 3 refills | Status: DC

## 2020-12-17 NOTE — Telephone Encounter (Signed)
Medication refill request approved and sent through [facility]

## 2021-03-04 DIAGNOSIS — D84821 Immunodeficiency due to drugs: Secondary | ICD-10-CM | POA: Insufficient documentation

## 2021-04-17 ENCOUNTER — Emergency Department (HOSPITAL_COMMUNITY): Payer: Medicare HMO

## 2021-04-17 ENCOUNTER — Other Ambulatory Visit: Payer: Self-pay

## 2021-04-17 ENCOUNTER — Encounter (HOSPITAL_COMMUNITY): Payer: Self-pay | Admitting: Emergency Medicine

## 2021-04-17 ENCOUNTER — Emergency Department (HOSPITAL_COMMUNITY)
Admission: EM | Admit: 2021-04-17 | Discharge: 2021-04-17 | Disposition: A | Discharge status: Home / Self Care | Payer: Medicare HMO | Attending: Emergency Medicine | Admitting: Emergency Medicine

## 2021-04-17 DIAGNOSIS — Z955 Presence of coronary angioplasty implant and graft: Secondary | ICD-10-CM | POA: Diagnosis not present

## 2021-04-17 DIAGNOSIS — E039 Hypothyroidism, unspecified: Secondary | ICD-10-CM | POA: Diagnosis not present

## 2021-04-17 DIAGNOSIS — M5431 Sciatica, right side: Secondary | ICD-10-CM | POA: Insufficient documentation

## 2021-04-17 DIAGNOSIS — M545 Low back pain, unspecified: Secondary | ICD-10-CM | POA: Diagnosis present

## 2021-04-17 DIAGNOSIS — Z79899 Other long term (current) drug therapy: Secondary | ICD-10-CM | POA: Insufficient documentation

## 2021-04-17 DIAGNOSIS — Z87891 Personal history of nicotine dependence: Secondary | ICD-10-CM | POA: Diagnosis not present

## 2021-04-17 DIAGNOSIS — Z7982 Long term (current) use of aspirin: Secondary | ICD-10-CM | POA: Diagnosis not present

## 2021-04-17 MED ORDER — PREDNISONE 10 MG PO TABS: ORAL_TABLET | ORAL | 0 refills | Status: DC

## 2021-04-17 MED ORDER — HYDROCODONE-ACETAMINOPHEN 5-325 MG PO TABS: 1.0000 | ORAL_TABLET | Freq: Four times a day (QID) | ORAL | 0 refills | Status: DC | PRN

## 2021-04-17 MED ORDER — PREDNISONE 50 MG PO TABS
60.0000 mg | ORAL_TABLET | Freq: Once | ORAL | Status: AC
  Administered 2021-04-17: 60 mg via ORAL
  Filled 2021-04-17: qty 1

## 2021-04-17 NOTE — Discharge Instructions (Addendum)
Take your next dose of prednisone tomorrow evening.  Use the the other medicines as directed.  Do not drive within 4 hours of taking hydrocodone as this will make you drowsy.  Avoid lifting,  Bending,  Twisting or any other activity that worsens your pain over the next week.  Apply a heating pad to your right lower back for 20 minutes several times daily.   You should get rechecked if your symptoms are not better over the next 5 days,  Or you develop increased pain, weakness in your leg(s) or loss of bladder or bowel function - these can be symptoms of a worsening condition.

## 2021-04-17 NOTE — ED Provider Notes (Signed)
Emergency Medicine Provider Triage Evaluation Note  Claudia Lewis , a 75 y.o. female  was evaluated in triage.  Pt complains of low back pain for the past 2 days with radiation down her right leg.  She denies weakness or numbness in the leg.  No known injuries and no prior history of similar symptoms..  Review of Systems  Positive: Low back and right leg pain. Negative: No fever or chills, no weakness or numbness in the extremity.  Physical Exam  BP (!) 155/77 (BP Location: Right Arm)   Pulse 82   Temp 98.5 F (36.9 C) (Oral)   Resp 17   Ht 5\' 5"  (1.651 m)   Wt 95.3 kg   SpO2 100%   BMI 34.95 kg/m  Gen:   Awake, no distress   Resp:  Normal effort  MSK:   Moves extremities without difficulty   Other:    Medical Decision Making  Medically screening exam initiated at 3:23 PM.  Appropriate orders placed.  Claudia Lewis was informed that the remainder of the evaluation will be completed by another provider, this initial triage assessment does not replace that evaluation, and the importance of remaining in the ED until their evaluation is complete.  Patient with new onset low back pain with radiation, possibly new onset sciatica.  Lumbar spine ordered.   [name], PA-C 04/17/21 1524    04/19/21, MD 04/19/21 [phone]

## 2021-04-17 NOTE — ED Triage Notes (Signed)
Pt c/o lower back pain that radiates down her right leg.

## 2021-04-19 ENCOUNTER — Other Ambulatory Visit: Payer: Self-pay

## 2021-04-19 ENCOUNTER — Ambulatory Visit (INDEPENDENT_AMBULATORY_CARE_PROVIDER_SITE_OTHER): Payer: Medicare HMO | Admitting: Student

## 2021-04-19 ENCOUNTER — Encounter: Payer: Self-pay | Admitting: Student

## 2021-04-19 VITALS — BP 132/74 | HR 79 | Ht 66.0 in | Wt 203.8 lb

## 2021-04-19 DIAGNOSIS — I201 Angina pectoris with documented spasm: Secondary | ICD-10-CM | POA: Diagnosis not present

## 2021-04-19 DIAGNOSIS — M069 Rheumatoid arthritis, unspecified: Secondary | ICD-10-CM | POA: Diagnosis not present

## 2021-04-19 DIAGNOSIS — I252 Old myocardial infarction: Secondary | ICD-10-CM

## 2021-04-19 DIAGNOSIS — G4733 Obstructive sleep apnea (adult) (pediatric): Secondary | ICD-10-CM

## 2021-04-19 NOTE — Progress Notes (Signed)
Cardiology Office Note    Date:  04/19/2021   ID:  Claudia Lewis, Claudia Lewis August 26, 1946, MRN [id number]  PCP:  Jeanice Lim, PA-C  Cardiologist: Nona Dell, MD    Chief Complaint  Patient presents with  . Follow-up    Annual Visit    History of Present Illness:    Claudia Lewis is a 75 y.o. female with past medical history of NSTEMI (occuirng in 11/2017 with cath showing normal cors and felt to be secondary to coronary vasospasm), Hypothyroidism and RA who presents to the office today for annual follow-up.  She most recently had a telehealth visit with Dr. Diona Browner in 03/2020 and denied any recent chest pain. She did describe occasional neuropathic pain along her left arm but had previously been diagnosed with cervical spine issues. She was continued on her current medication regimen including ASA 81 mg daily and Imdur 15 mg daily.  In talking with the patient today, she reports overall doing well from a cardiac perspective since her last visit. She remains active in doing chores around the house and helping take care of her grandchildren and denies any recent exertional chest pain. Activity is limited secondary to her rheumatoid arthritis. She does report intermittent dyspnea and questions if this is secondary to using her CPAP sporadically as she does notice her breathing is improved on the days that she has used her CPAP the night before. She denies any specific orthopnea, PND or pitting edema.   Past Medical History:  Diagnosis Date  . Anemia    a. noted on 11/2017 labs.  . Arthritis   . Hyperlipidemia   . Hypokalemia   . Hypothyroidism   . NSTEMI (non-ST elevated myocardial infarction) (HCC)    a. 11/2017 - cath with normal, tortuous arteries, no culprit, EF normal.    Past Surgical History:  Procedure Laterality Date  . ABDOMINAL HYSTERECTOMY    . LEFT HEART CATH AND CORONARY ANGIOGRAPHY N/A 12/03/2017   Procedure: LEFT HEART CATH AND CORONARY ANGIOGRAPHY;   Surgeon: Runell Gess, MD;  Location: [facility];  Service: Cardiovascular;  Laterality: N/A;    Current Medications: Outpatient Medications Prior to Visit  Medication Sig Dispense Refill  . acetaminophen (TYLENOL) 500 MG tablet Take 500 mg by mouth every 6 (six) hours as needed for mild pain or moderate pain.     Marland Kitchen aspirin EC 81 MG tablet Take 81 mg by mouth daily.    . cholecalciferol (VITAMIN D) 1000 units tablet Take 1,000 Units by mouth daily.     . DULoxetine (CYMBALTA) 30 MG capsule Take 1 capsule by mouth once daily    . folic acid (FOLVITE) 1 MG tablet Take 1 tablet by mouth daily.  11  . HYDROcodone-acetaminophen (NORCO/VICODIN) 5-325 MG tablet Take 1 tablet by mouth every 6 (six) hours as needed for moderate pain. 12 tablet 0  . isosorbide mononitrate (IMDUR) 30 MG 24 hr tablet Take 1/2 (one-half) tablet by mouth once daily 45 tablet 3  . levothyroxine (SYNTHROID, LEVOTHROID) 25 MCG tablet Take 25 mcg by mouth daily.  2  . methotrexate (RHEUMATREX) 2.5 MG tablet Take 15 mg by mouth every Sunday. 3 tabs twice daily on Sundays only    . pantoprazole (PROTONIX) 20 MG tablet Take 1 tablet by mouth daily.  1  . predniSONE (DELTASONE) 10 MG tablet Take 6 tablets day one, 5 tablets day two, 4 tablets day three, 3 tablets day four, 2 tablets day five, then  1 tablet day six 21 tablet 0   No facility-administered medications prior to visit.     Allergies:   Patient has no known allergies.   Social History   Socioeconomic History  . Marital status: Married    Spouse name: Not on file  . Number of children: 3  . Years of education: Not on file  . Highest education level: Not on file  Occupational History  . Not on file  Tobacco Use  . Smoking status: Former Smoker    Quit date: 12/03/1988    Years since quitting: 32.3  . Smokeless tobacco: Never Used  Vaping Use  . Vaping Use: Never used  Substance and Sexual Activity  . Alcohol use: No  . Drug use: No  . Sexual  activity: Not Currently  Other Topics Concern  . Not on file  Social History Narrative  . Not on file   Social Determinants of Health   Financial Resource Strain: Not on file  Food Insecurity: Not on file  Transportation Needs: Not on file  Physical Activity: Not on file  Stress: Not on file  Social Connections: Not on file     Family History:  The patient's family history includes Hypertension in her mother.   Review of Systems:    Please see the history of present illness.     All other systems reviewed and are otherwise negative except as noted above.   Physical Exam:    VS:  BP 132/74   Pulse 79   Ht 5\' 6"  (1.676 m)   Wt 203 lb 12.8 oz (92.4 kg)   SpO2 95%   BMI 32.89 kg/m    General: Well developed, well nourished,female appearing in no acute distress. Head: Normocephalic, atraumatic. Neck: No carotid bruits. JVD not elevated.  Lungs: Respirations regular and unlabored, without wheezes or rales.  Heart: Regular rate and rhythm. No S3 or S4.  No murmur, no rubs, or gallops appreciated. Abdomen: Appears non-distended. No obvious abdominal masses. Msk:  Strength and tone appear normal for age. No obvious joint deformities or effusions. Extremities: No clubbing or cyanosis. No lower extremity edema.  Distal pedal pulses are 2+ bilaterally. Neuro: Alert and oriented X 3. Moves all extremities spontaneously. No focal deficits noted. Psych:  Responds to questions appropriately with a normal affect. Skin: No rashes or lesions noted  Wt Readings from Last 3 Encounters:  04/19/21 203 lb 12.8 oz (92.4 kg)  04/17/21 210 lb (95.3 kg)  04/09/20 202 lb (91.6 kg)     Studies/Labs Reviewed:   EKG:  EKG is ordered today. The EKG ordered today demonstrates NSR, HR 81 with borderline LVH and isolated TWI along Lead III.   Recent Labs: No results found for requested labs within last 8760 hours.   Lipid Panel No results found for: CHOL, TRIG, HDL, CHOLHDL, VLDL, LDLCALC,  LDLDIRECT  Additional studies/ records that were reviewed today include:   Cardiac Catheterization: 11/2017  The left ventricular systolic function is normal.  LV end diastolic pressure is normal.  The left ventricular ejection fraction is 55-65% by visual estimate.  IMPRESSION: Ms. Dilmore has essentially normal coronary arteries and normal LV function. Her arteries are somewhat tortuous system with hypertensive heart disease." Lesions. Medical therapy will be recommended. The sheath was removed and a TR band was placed on the right wrist which is patent hemostasis. The patient left the lab in stable condition. She can be discharged later today and followed up closely as an outpatient  Assessment:    1. History of non-ST elevation myocardial infarction (NSTEMI)   2. Coronary vasospasm (HCC)   3. Rheumatoid arthritis involving multiple sites, unspecified whether rheumatoid factor present (HCC)   4. OSA (obstructive sleep apnea)      Plan:   In order of problems listed above:  1. History of NSTEMI/Coronary Vasospasm - She previously had an NSTEMI in 11/2017 and cardiac catheterization at that time showed normal coronary arteries and her episode was felt to be secondary to coronary vasospasm. - She reports one brief episode of chest pain a few months ago but symptoms spontaneously resolved. Denies any recurrent symptoms since. Will continue her current regimen for now with ASA 81 mg daily and Imdur 15 mg daily. I encouraged her to reach out if she has more frequent symptoms as Imdur could be further titrated.  2. Rheumatoid Arthritis - Followed by Rheumatology and she remains on Methotrexate.  3. OSA - Compliance with CPAP on a nightly basis was encouraged.   Medication Adjustments/Labs and Tests Ordered: Current medicines are reviewed at length with the patient today.  Concerns regarding medicines are outlined above.  Medication changes, Labs and Tests ordered today are listed  in the Patient Instructions below. Patient Instructions  Medication Instructions:  Your physician recommends that you continue on your current medications as directed. Please refer to the Current Medication list given to you today.  *If you need a refill on your cardiac medications before your next appointment, please call your pharmacy*   Lab Work: NONE   If you have labs (blood work) drawn today and your tests are completely normal, you will receive your results only by: Marland Kitchen MyChart Message (if you have MyChart) OR . A paper copy in the mail If you have any lab test that is abnormal or we need to change your treatment, we will call you to review the results.   Testing/Procedures: NONE    Follow-Up: At [facility], you and your health needs are our priority.  As part of our continuing mission to provide you with exceptional heart care, we have created designated Provider Care Teams.  These Care Teams include your primary Cardiologist (physician) and Advanced Practice Providers (APPs -  Physician Assistants and Nurse Practitioners) who all work together to provide you with the care you need, when you need it.  We recommend signing up for the patient portal called "MyChart".  Sign up information is provided on this After Visit Summary.  MyChart is used to connect with patients for Virtual Visits (Telemedicine).  Patients are able to view lab/test results, encounter notes, upcoming appointments, etc.  Non-urgent messages can be sent to your provider as well.   To learn more about what you can do with MyChart, go to [email or web address].    Your next appointment:   1 year(s)  The format for your next appointment:   In Person  Provider:   Nona Dell, MD   Other Instructions Thank you for choosing [facility]!       Signed, Ellsworth Lennox, PA-C  04/19/2021 4:57 PM     [facility] [address] [place], [place] [address] Phone:  [phone] Fax: [phone]

## 2021-04-19 NOTE — Patient Instructions (Signed)
Medication Instructions:  Your physician recommends that you continue on your current medications as directed. Please refer to the Current Medication list given to you today.  *If you need a refill on your cardiac medications before your next appointment, please call your pharmacy*   Lab Work: NONE   If you have labs (blood work) drawn today and your tests are completely normal, you will receive your results only by: MyChart Message (if you have MyChart) OR A paper copy in the mail If you have any lab test that is abnormal or we need to change your treatment, we will call you to review the results.   Testing/Procedures: NONE    Follow-Up: At [facility], you and your health needs are our priority.  As part of our continuing mission to provide you with exceptional heart care, we have created designated Provider Care Teams.  These Care Teams include your primary Cardiologist (physician) and Advanced Practice Providers (APPs -  Physician Assistants and Nurse Practitioners) who all work together to provide you with the care you need, when you need it.  We recommend signing up for the patient portal called "MyChart".  Sign up information is provided on this After Visit Summary.  MyChart is used to connect with patients for Virtual Visits (Telemedicine).  Patients are able to view lab/test results, encounter notes, upcoming appointments, etc.  Non-urgent messages can be sent to your provider as well.   To learn more about what you can do with MyChart, go to [email or web address].    Your next appointment:   1 year(s)  The format for your next appointment:   In Person  Provider:   Samuel McDowell, MD   Other Instructions Thank you for choosing [facility]!    

## 2021-04-29 NOTE — ED Provider Notes (Signed)
[facility] EMERGENCY DEPARTMENT Provider Note   CSN: [id number] Arrival date & time: 04/17/21  1449     History Chief Complaint  Patient presents with  . Back Pain    Claudia Lewis is a 75 y.o. female presenting with acute right lower back pain which has which has been present for the past 2 days.   Patient denies any new injury specifically.  There is radiation of burning, aching quality pain into the right posterior thigh.   There has been no weakness or numbness in the lower extremities and no urinary or bowel retention or incontinence. She denies abdominal pain or distention.  Patient does not have a history of cancer or IVDU.  The patient has tried tylenol and rest without significant relief of symptoms.   HPI     Past Medical History:  Diagnosis Date  . Anemia    a. noted on 11/2017 labs.  . Arthritis   . Hyperlipidemia   . Hypokalemia   . Hypothyroidism   . NSTEMI (non-ST elevated myocardial infarction) (HCC)    a. 11/2017 - cath with normal, tortuous arteries, no culprit, EF normal.    Patient Active Problem List   Diagnosis Date Noted  . NSTEMI (non-ST elevated myocardial infarction) (HCC) 12/03/2017    Past Surgical History:  Procedure Laterality Date  . ABDOMINAL HYSTERECTOMY    . LEFT HEART CATH AND CORONARY ANGIOGRAPHY N/A 12/03/2017   Procedure: LEFT HEART CATH AND CORONARY ANGIOGRAPHY;  Surgeon: Runell Gess, MD;  Location: [facility]VAS[facility] CV LAB;  Service: Cardiovascular;  Laterality: N/A;     OB History    Gravida  3   Para  3   Term      Preterm      AB      Living  2     SAB      IAB      Ectopic      Multiple      Live Births              Family History  Problem Relation Age of Onset  . Hypertension Mother     Social History   Tobacco Use  . Smoking status: Former Smoker    Quit date: 12/03/1988    Years since quitting: 32.4  . Smokeless tobacco: Never Used  Vaping Use  . Vaping Use: Never used  Substance Use  Topics  . Alcohol use: No  . Drug use: No    Home Medications Prior to Admission medications   Medication Sig Start Date End Date Taking? Authorizing Provider  HYDROcodone-acetaminophen (NORCO/VICODIN) 5-325 MG tablet Take 1 tablet by mouth every 6 (six) hours as needed for moderate pain. 04/17/21  Yes Kagan Mutchler, Raynelle Fanning, PA-C  predniSONE (DELTASONE) 10 MG tablet Take 6 tablets day one, 5 tablets day two, 4 tablets day three, 3 tablets day four, 2 tablets day five, then 1 tablet day six 04/17/21  Yes Addalie Calles, Raynelle Fanning, PA-C  acetaminophen (TYLENOL) 500 MG tablet Take 500 mg by mouth every 6 (six) hours as needed for mild pain or moderate pain.     [provider]  aspirin EC 81 MG tablet Take 81 mg by mouth daily.    [provider]  cholecalciferol (VITAMIN D) 1000 units tablet Take 1,000 Units by mouth daily.     [provider]  DULoxetine (CYMBALTA) 30 MG capsule Take 1 capsule by mouth once daily 04/15/19   [provider]  folic acid (FOLVITE)  1 MG tablet Take 1 tablet by mouth daily. 04/10/15   [provider]  isosorbide mononitrate (IMDUR) 30 MG 24 hr tablet Take 1/2 (one-half) tablet by mouth once daily 12/17/20   Jonelle Sidle, MD  levothyroxine (SYNTHROID, LEVOTHROID) 25 MCG tablet Take 25 mcg by mouth daily. 03/30/15   [provider]  methotrexate (RHEUMATREX) 2.5 MG tablet Take 15 mg by mouth every Sunday. 3 tabs twice daily on Sundays only    [provider]  pantoprazole (PROTONIX) 20 MG tablet Take 1 tablet by mouth daily. 04/15/15   [provider]    Allergies    Patient has no known allergies.  Review of Systems   Review of Systems  Constitutional: Negative for fever.  Respiratory: Negative for shortness of breath.   Cardiovascular: Negative for chest pain and leg swelling.  Gastrointestinal: Negative for abdominal distention, abdominal pain and constipation.  Genitourinary: Negative for difficulty urinating,  dysuria, flank pain, frequency and urgency.  Musculoskeletal: Positive for back pain. Negative for gait problem and joint swelling.  Skin: Negative for rash.  Neurological: Negative for weakness and numbness.    Physical Exam Updated Vital Signs BP (!) 155/77 (BP Location: Right Arm)   Pulse 82   Temp 98.5 F (36.9 C) (Oral)   Resp 17   Ht 5\' 5"  (1.651 m)   Wt 95.3 kg   SpO2 100%   BMI 34.95 kg/m   Physical Exam Vitals and nursing note reviewed.  Constitutional:      Appearance: She is well-developed.  HENT:     Head: Normocephalic.  Eyes:     Conjunctiva/sclera: Conjunctivae normal.  Cardiovascular:     Rate and Rhythm: Normal rate.     Comments: Pedal pulses normal. Pulmonary:     Effort: Pulmonary effort is normal.  Abdominal:     General: Bowel sounds are normal. There is no distension.     Palpations: Abdomen is soft. There is no mass.  Musculoskeletal:        General: Normal range of motion.     Cervical back: Normal range of motion and neck supple.     Lumbar back: Tenderness present. No swelling, edema or spasms.  Skin:    General: Skin is warm and dry.  Neurological:     Mental Status: She is alert.     Sensory: No sensory deficit.     Motor: No tremor or atrophy.     Gait: Gait normal.     Deep Tendon Reflexes:     Reflex Scores:      Patellar reflexes are 2+ on the right side and 2+ on the left side.      Achilles reflexes are 2+ on the right side and 2+ on the left side.    Comments: No strength deficit noted in hip and knee flexor and extensor muscle groups.  Ankle flexion and extension intact.     ED Results / Procedures / Treatments   Labs (all labs ordered are listed, but only abnormal results are displayed) Labs Reviewed - No data to display  EKG None  Radiology  DG Lumbar Spine Complete  Result Date: 04/17/2021 CLINICAL DATA:  Lower back and right lower extremity pain without known injury. EXAM: LUMBAR SPINE - COMPLETE 4+ VIEW  COMPARISON:  Apr 23, 2015. FINDINGS: No fracture or significant spondylolisthesis is noted. Moderate degenerative disc disease is noted at L3-4, L4-5 and L5-S1. Degenerative changes are seen involving posterior facet joints of L4-5 and L5-S1.  IMPRESSION: Multilevel degenerative disc disease. No acute abnormality is noted. Electronically Signed   By: Lupita Raider M.D.   On: 04/17/2021 15:45     Procedures Procedures   Medications Ordered in ED Medications  predniSONE (DELTASONE) tablet 60 mg (60 mg Oral Given 04/17/21 1805)    ED Course  I have reviewed the triage vital signs and the nursing notes.  Pertinent labs & imaging results that were available during my care of the patient were reviewed by me and considered in my medical decision making (see chart for details).    MDM Rules/Calculators/A&P                         Discussed xray findings including djd and ddd present.  No metastatic bone disease noted on these plain film studies.  History suggesting sciatica, no sx suggesting cauda equina.  abd soft, nontender, no pulsatile mass, doubt dissection as source of back pain.   No neuro deficit on exam or by history to suggest emergent or surgical presentation.  Discussed worsened sx that should prompt immediate re-evaluation including distal weakness, bowel/bladder retention/incontinence.   Final Clinical Impression(s) / ED Diagnoses Final diagnoses:  Sciatica of right side    Rx / DC Orders ED Discharge Orders         Ordered    predniSONE (DELTASONE) 10 MG tablet        04/17/21 1813    HYDROcodone-acetaminophen (NORCO/VICODIN) 5-325 MG tablet  Every 6 hours PRN        04/17/21 1813           Burgess Amor, Cordelia Poche 04/29/21 1106    Bethann Berkshire, MD 04/29/21 2245

## 2021-06-27 ENCOUNTER — Telehealth: Payer: Self-pay | Admitting: Cardiology

## 2021-06-27 MED ORDER — ISOSORBIDE MONONITRATE ER 30 MG PO TB24: ORAL_TABLET | ORAL | 3 refills | Status: DC

## 2021-06-27 NOTE — Telephone Encounter (Signed)
Refill sent as requested. 

## 2021-06-27 NOTE — Telephone Encounter (Signed)
*  STAT* If patient is at the pharmacy, call can be transferred to refill team.   1. Which medications need to be refilled? (please list name of each medication and dose if known) isosorbide mononitrate (IMDUR) 30 MG 24 hr tablet  2. Which pharmacy/location (including street and city if local pharmacy) is medication to be sent to? [facility]   3. Do they need a 30 day or 90 day supply? 90

## 2021-09-24 IMAGING — DX DG LUMBAR SPINE COMPLETE 4+V
5 series · 5 of 5 positions shown · non-contrast
Comparison: April 23, 2015.

CLINICAL DATA: Lower back and right lower extremity pain without
known injury.

EXAM:
LUMBAR SPINE - COMPLETE 4+ VIEW

[l-spine ap]
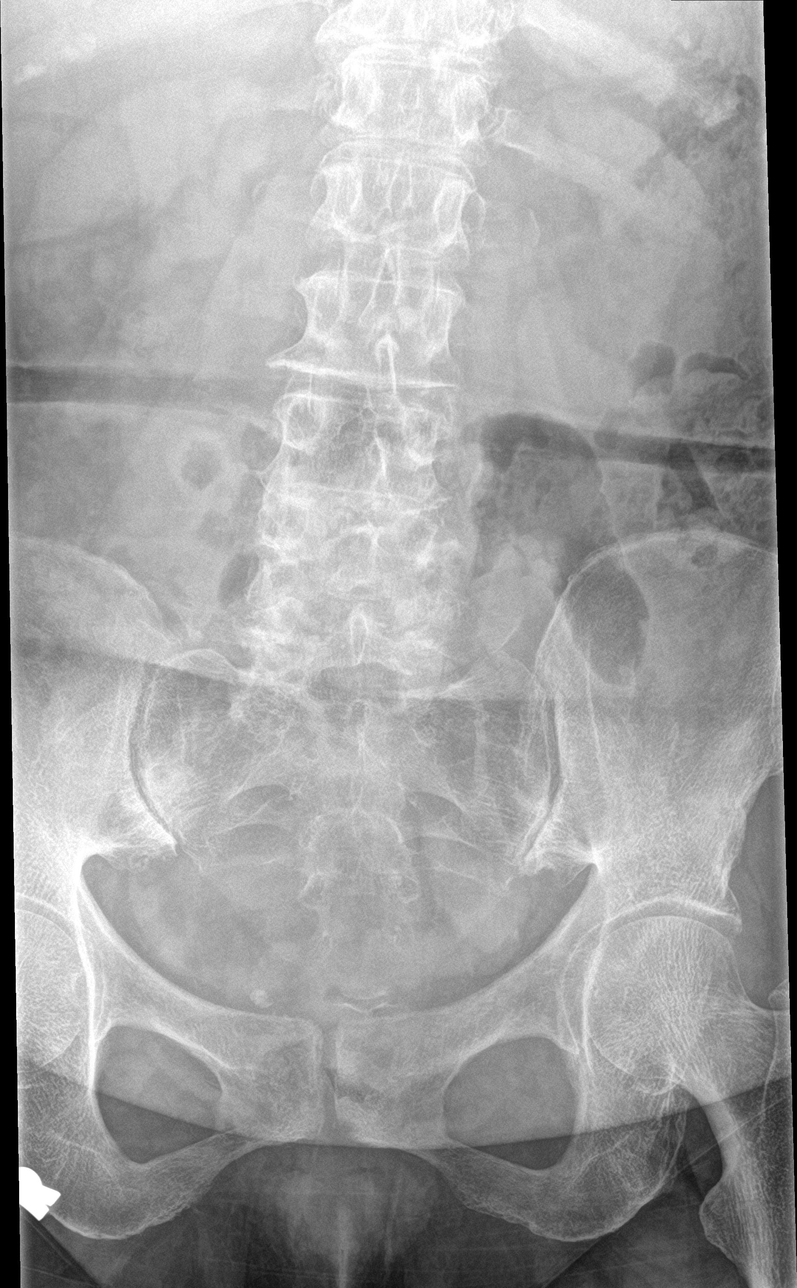

[l-spine obl (1 of 2)]
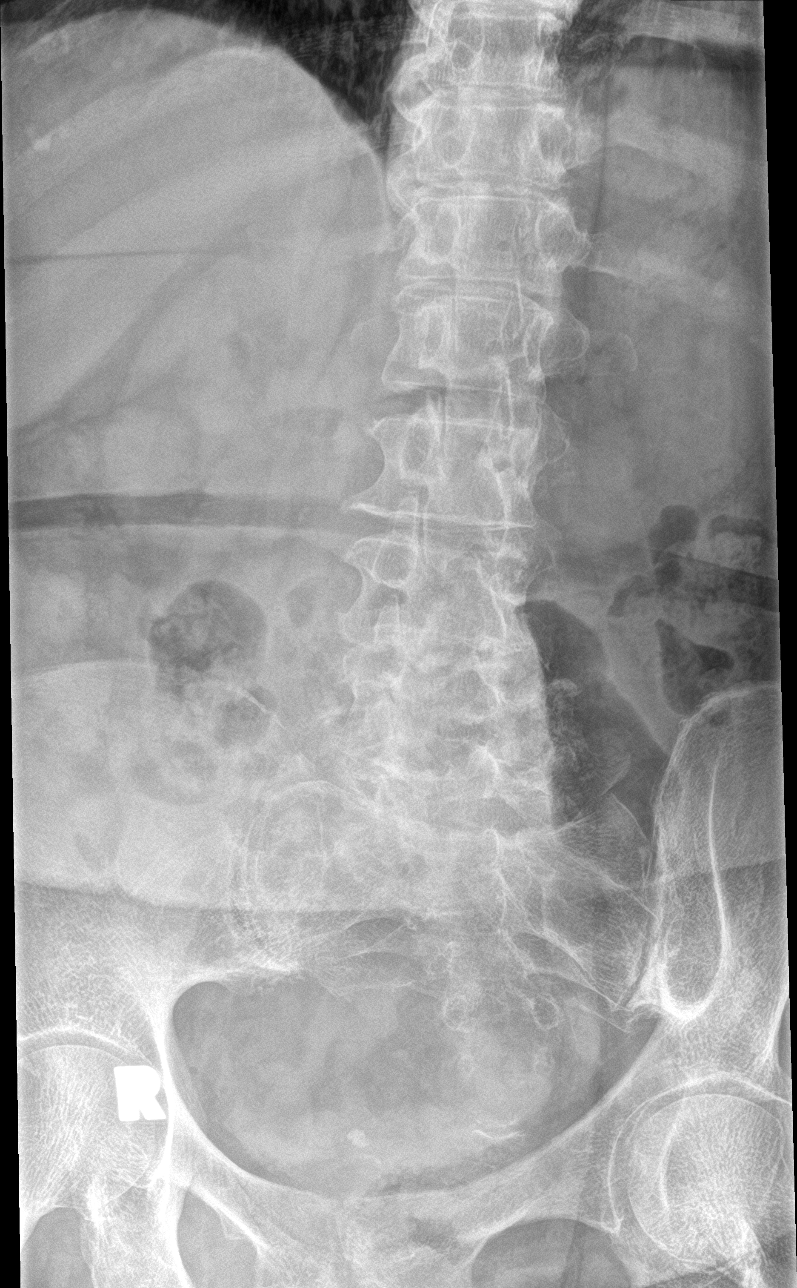

[l-spine obl (2 of 2)]
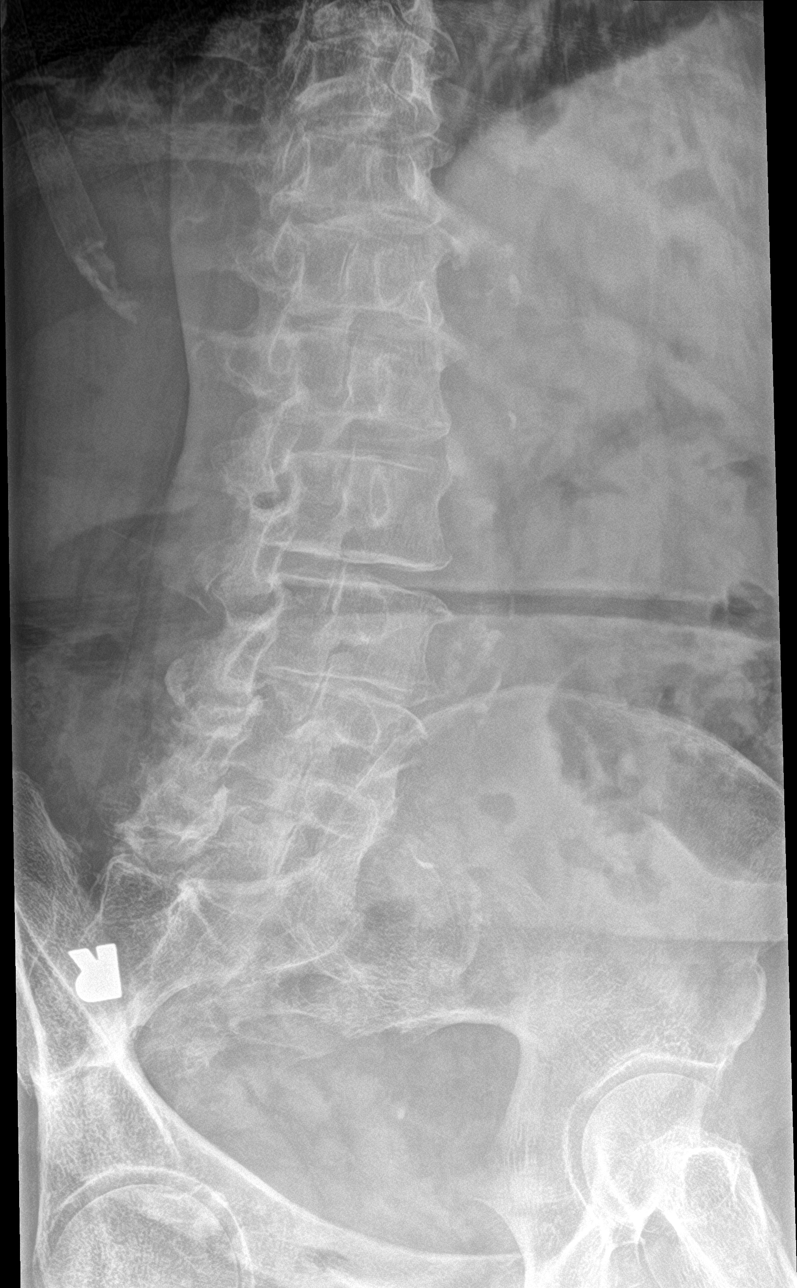

[l-spine lat]
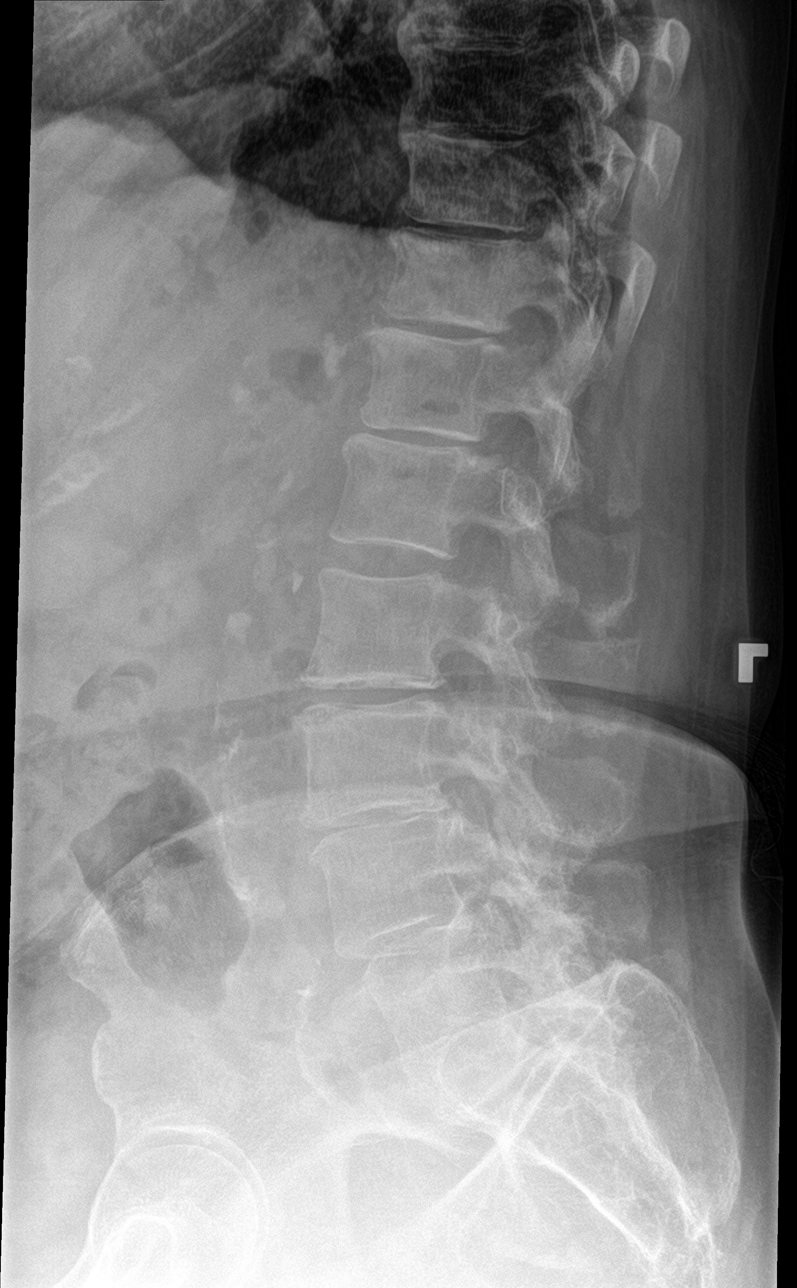

[l-spine spot]
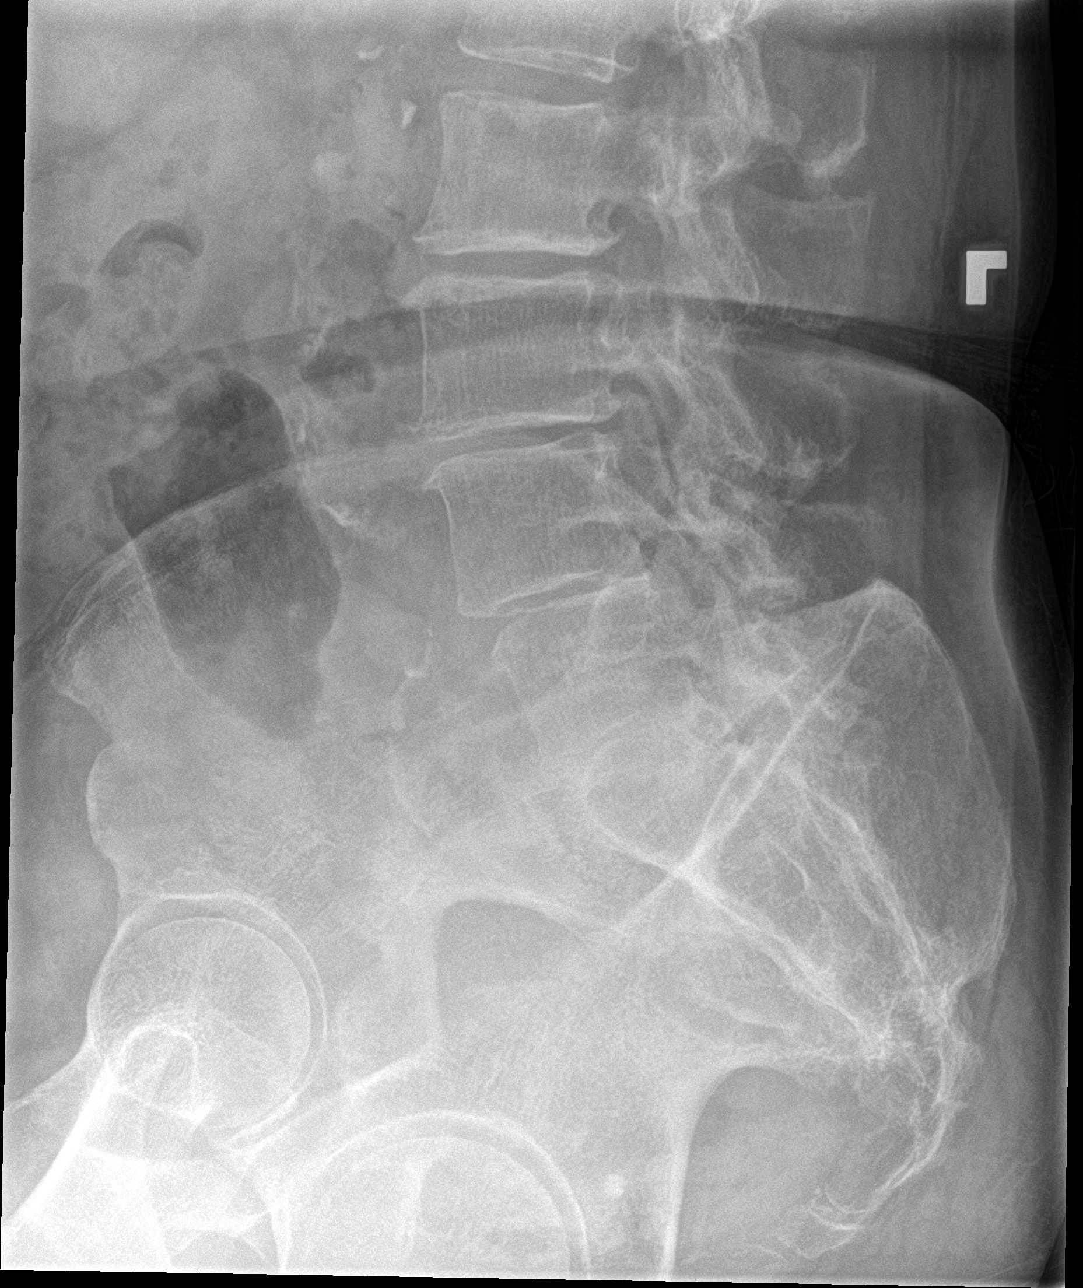

[5 of 5 positions shown; findings below may reference images not displayed]

FINDINGS: No fracture or significant spondylolisthesis is noted. Moderate
degenerative disc disease is noted at L3-4, L4-5 and L5-S1.
Degenerative changes are seen involving posterior facet joints of
L4-5 and L5-S1.
IMPRESSION: Multilevel degenerative disc disease. No acute abnormality is noted.

## 2021-11-27 HISTORY — PX: OTHER SURGICAL HISTORY: SHX169

## 2021-12-28 DIAGNOSIS — G4733 Obstructive sleep apnea (adult) (pediatric): Secondary | ICD-10-CM | POA: Insufficient documentation

## 2022-03-17 DIAGNOSIS — Z789 Other specified health status: Secondary | ICD-10-CM | POA: Insufficient documentation

## 2022-06-22 ENCOUNTER — Other Ambulatory Visit: Payer: Self-pay | Admitting: Cardiology

## 2022-08-15 ENCOUNTER — Other Ambulatory Visit: Payer: Self-pay | Admitting: Cardiology

## 2022-10-30 ENCOUNTER — Other Ambulatory Visit: Payer: Self-pay | Admitting: Cardiology

## 2022-11-27 ENCOUNTER — Other Ambulatory Visit: Payer: Self-pay | Admitting: Cardiology

## 2022-12-26 ENCOUNTER — Other Ambulatory Visit: Payer: Self-pay | Admitting: Cardiology

## 2023-01-15 ENCOUNTER — Other Ambulatory Visit: Payer: Self-pay | Admitting: Adult Health

## 2023-01-15 DIAGNOSIS — E041 Nontoxic single thyroid nodule: Secondary | ICD-10-CM

## 2023-01-22 ENCOUNTER — Other Ambulatory Visit: Payer: Self-pay | Admitting: Cardiology

## 2023-02-07 ENCOUNTER — Other Ambulatory Visit: Payer: Medicare HMO

## 2023-02-07 NOTE — Progress Notes (Unsigned)
    Cardiology Office Note  Date: 02/08/2023   ID: Finesse, Fielder 02-Sep-1946, MRN [id number]  History of Present Illness: Claudia Lewis is a 77 y.o. female last seen in May 2022 by Ms. Strader PA-C, I reviewed the note.  She is here for a follow-up visit.  Reports no recurring exertional chest pain, stable NYHA class II dyspnea with typical activities.  She does not report any palpitations or syncope.  Now using Inspire for treatment of OSA, states that this has been very effective for her.  She follows with a PCP in [place], still living in [place].  I reviewed her medications.  Today's ECG shows sinus rhythm followed by an ectopic atrial rhythm, no acute ST segment changes.  Physical Exam: VS:  BP 136/76   Pulse 74   Ht 5' 3.5" (1.613 m)   Wt 197 lb (89.4 kg)   SpO2 95%   BMI 34.35 kg/m , BMI Body mass index is 34.35 kg/m.  Wt Readings from Last 3 Encounters:  02/08/23 197 lb (89.4 kg)  04/19/21 203 lb 12.8 oz (92.4 kg)  04/17/21 210 lb (95.3 kg)    General: Patient appears comfortable at rest. HEENT: Conjunctiva and lids normal. Neck: Supple, no elevated JVP or carotid bruits. Lungs: Clear to auscultation, nonlabored breathing at rest. Cardiac: Regular rate and rhythm, no S3 or significant systolic murmur. Extremities: No pitting edema.  ECG:  An ECG dated 04/19/2021 was personally reviewed today and demonstrated:  Sinus rhythm with LVH and left anterior fascicular block.  Labwork:  No recent lab work for review today.  Other Studies Reviewed Today:  No interval cardiac testing for review today.  Assessment and Plan:  1.  History of NSTEMI in the setting of suspected coronary vasospasm with no obstructive CAD at cardiac catheterization in January 2019.  She does not report any recurring chest pain and continues on low-dose Imdur.  ECG reviewed today.  Discussed regular exercise plan.  No clear indication for follow-up ischemic testing at this time.  2.   OSA using Inspire.  Disposition:  Follow up  1 year.  Signed, Satira Sark, M.D., F.A.C.C.

## 2023-02-08 ENCOUNTER — Encounter: Payer: Self-pay | Admitting: Cardiology

## 2023-02-08 ENCOUNTER — Ambulatory Visit: Payer: Medicare HMO | Attending: Cardiology | Admitting: Cardiology

## 2023-02-08 ENCOUNTER — Ambulatory Visit
Admission: RE | Admit: 2023-02-08 | Discharge: 2023-02-08 | Disposition: A | Discharge status: Home / Self Care | Payer: Medicare HMO | Source: Ambulatory Visit | Attending: Adult Health | Admitting: Adult Health

## 2023-02-08 VITALS — BP 136/76 | HR 74 | Ht 63.5 in | Wt 197.0 lb

## 2023-02-08 DIAGNOSIS — G4733 Obstructive sleep apnea (adult) (pediatric): Secondary | ICD-10-CM | POA: Diagnosis not present

## 2023-02-08 DIAGNOSIS — E041 Nontoxic single thyroid nodule: Secondary | ICD-10-CM

## 2023-02-08 DIAGNOSIS — I201 Angina pectoris with documented spasm: Secondary | ICD-10-CM | POA: Diagnosis not present

## 2023-02-08 NOTE — Patient Instructions (Signed)
Medication Instructions:  Your physician recommends that you continue on your current medications as directed. Please refer to the Current Medication list given to you today.   Labwork: None today  Testing/Procedures: None today  Follow-Up: 1 year  Any Other Special Instructions Will Be Listed Below (If Applicable).  If you need a refill on your cardiac medications before your next appointment, please call your pharmacy.  

## 2023-02-09 ENCOUNTER — Other Ambulatory Visit: Payer: Medicare HMO

## 2023-02-19 ENCOUNTER — Ambulatory Visit: Payer: Medicare HMO | Admitting: Gastroenterology

## 2023-02-19 ENCOUNTER — Encounter: Payer: Self-pay | Admitting: Gastroenterology

## 2023-02-19 ENCOUNTER — Telehealth: Payer: Self-pay | Admitting: Gastroenterology

## 2023-02-19 VITALS — BP 121/75 | HR 80 | Temp 97.7°F | Ht 63.5 in | Wt 196.4 lb

## 2023-02-19 DIAGNOSIS — Z86010 Personal history of colonic polyps: Secondary | ICD-10-CM | POA: Diagnosis not present

## 2023-02-19 DIAGNOSIS — Z01818 Encounter for other preprocedural examination: Secondary | ICD-10-CM

## 2023-02-19 MED ORDER — ONDANSETRON HCL 4 MG PO TABS: 4.0000 mg | ORAL_TABLET | Freq: Three times a day (TID) | ORAL | 0 refills | Status: DC | PRN

## 2023-02-19 NOTE — Progress Notes (Signed)
GI Office Note    Referring Provider: Aline Brochure, * Primary Care Physician:  Liane Comber, NP  Primary Gastroenterologist: Elon Alas. Abbey Chatters, DO  Chief Complaint   Chief Complaint  Patient presents with   New Patient (Initial Visit)    Pt here for colonoscopy visit    History of Present Illness   Claudia Lewis is a 77 y.o. female presenting today at the request of Haubert, Gordan Payment, * for colonoscopy.   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  Nonobstructive CAD present.  PCP referral reports patient had a last colonoscopy in March 2019 with polyps and advised to repeat in March or 2024.  Last EKG 02/08/2023: Sinus rhythm with ectopic atrial rhythm.  Follows regularly with cardiology.  Labs 01/01/23: CRP 7, creatinine 0.85, BUN 15, T. Bili 0.2, alk phos 86, AST 19, ALT 19, Hgb 11.6, MCV 85, plts 319   Today: Has her last colonoscopy. Rondall Allegra at [facility]. She repotrs she did have polyps during that time. Also had polyps on her first colonoscopy. Reports her father had kidney cancer and her mother had lung cancer but does not thignk anyone had colon cancer.   Has had reflux since her teeth broke. Her top denture broke and feel as though that was contributing to her reflux.  No N/V,dysphagia, abdominal pain. Is on iron and has dark stools with that. Has been tacking iron for the last few months. Has a good appetite, unintentional weight loss, early satiety. No constipation or diarrhea. Has a BM mostly everyday. Sometimes every other day.   Report she also throws up her prep before she starts having bowel movements.    Current Outpatient Medications  Medication Sig Dispense Refill   acetaminophen (TYLENOL) 500 MG tablet Take 500 mg by mouth every 6 (six) hours as needed for mild pain or moderate pain.      aspirin EC 81 MG tablet Take 81 mg by mouth daily. Swallow whole.     cholecalciferol (VITAMIN D) 1000 units  tablet Take 1,000 Units by mouth daily.      DULoxetine (CYMBALTA) 30 MG capsule Take 1 capsule by mouth once daily     ferrous sulfate 325 (65 FE) MG EC tablet Take 1 tablet by mouth 2 (two) times daily.     folic acid (FOLVITE) 1 MG tablet Take 1 tablet by mouth daily.  11   isosorbide mononitrate (IMDUR) 30 MG 24 hr tablet TAKE 1/2 TABLET ONE TIME DAILY 15 tablet 3   levothyroxine (SYNTHROID, LEVOTHROID) 25 MCG tablet Take 50 mcg by mouth daily.  2   methotrexate (RHEUMATREX) 2.5 MG tablet Take 15 mg by mouth every Sunday. 3 tabs twice daily on Sundays only     pantoprazole (PROTONIX) 20 MG tablet Take 1 tablet by mouth daily.  1   No current facility-administered medications for this visit.    Past Medical History:  Diagnosis Date   Anemia    a. noted on 11/2017 labs.   Arthritis    Hyperlipidemia    Hypokalemia    Hypothyroidism    NSTEMI (non-ST elevated myocardial infarction) ([place])    a. 11/2017 - cath with normal, tortuous arteries, no culprit, EF normal.    Past Surgical History:  Procedure Laterality Date   ABDOMINAL HYSTERECTOMY     inspire  2023   LEFT HEART CATH AND CORONARY ANGIOGRAPHY N/A 12/03/2017   Procedure: LEFT HEART CATH AND CORONARY ANGIOGRAPHY;  Surgeon: Lorretta Harp,  MD;  Location: [facility];  Service: Cardiovascular;  Laterality: N/A;    Family History  Problem Relation Age of Onset   Hypertension Mother     Allergies as of 02/19/2023   (No Known Allergies)    Social History   Socioeconomic History   Marital status: Married    Spouse name: Not on file   Number of children: 3   Years of education: Not on file   Highest education level: Not on file  Occupational History   Not on file  Tobacco Use   Smoking status: Former    Types: Cigarettes    Quit date: 12/03/1988    Years since quitting: 34.2   Smokeless tobacco: Never  Vaping Use   Vaping Use: Never used  Substance and Sexual Activity   Alcohol use: No   Drug use: No    Sexual activity: Not Currently  Other Topics Concern   Not on file  Social History Narrative   Not on file   Social Determinants of Health   Financial Resource Strain: Not on file  Food Insecurity: Not on file  Transportation Needs: Not on file  Physical Activity: Not on file  Stress: Not on file  Social Connections: Not on file  Intimate Partner Violence: Not on file     Review of Systems   Gen: Denies any fever, chills, fatigue, weight loss, lack of appetite.  CV: Denies chest pain, heart palpitations, peripheral edema, syncope.  Resp: Denies shortness of breath at rest or with exertion. Denies wheezing or cough.  GI: see HPI GU : Denies urinary burning, urinary frequency, urinary hesitancy MS: Denies joint pain, muscle weakness, cramps, or limitation of movement.  Derm: Denies rash, itching, dry skin Psych: Denies depression, anxiety, memory loss, and confusion Heme: Denies bruising, bleeding, and enlarged lymph nodes.   Physical Exam   BP 121/75   Pulse 80   Temp 97.7 F (36.5 C)   Ht 5' 3.5" (1.613 m)   Wt 196 lb 6.4 oz (89.1 kg)   BMI 34.24 kg/m   General:   Alert and oriented. Pleasant and cooperative. Well-nourished and well-developed.  Head:  Normocephalic and atraumatic. Eyes:  Without icterus, sclera clear and conjunctiva pink.  Ears:  Normal auditory acuity. Mouth:  No deformity or lesions, oral mucosa pink.  Lungs:  Clear to auscultation bilaterally. No wheezes, rales, or rhonchi. No distress.  Heart:  S1, S2 present without murmurs appreciated.  Abdomen:  +BS, soft, non-tender and non-distended. No HSM noted. No guarding or rebound. No masses appreciated.  Rectal:  Deferred Msk:  Symmetrical without gross deformities. Normal posture. Extremities:  Without edema. Neurologic:  Alert and  oriented x4;  grossly normal neurologically. Skin:  Intact without significant lesions or rashes. Psych:  Alert and cooperative. Normal mood and  affect.   Assessment   Claudia Lewis is a 77 y.o. female with a history of pregnanetriol, depression, hypothyroidism, coronary vasospasm, NSTEMI in 2019 presenting today with   History of colon polyps: Patient reports her last colonoscopy was in 2019.  PCP paperwork reports polyps.  Patient also states she believes she had polyps.  No family history of colon cancer or colon polyps that she is aware of.  No alarm symptoms present.  She denies any melena, BRBPR, constipation, diarrhea, abdominal pain, early satiety, lack of appetite, weight loss.  Will proceed with surveillance colonoscopy in the near future with Dr. Abbey Chatters.  She will need to hold her iron for  7 days prior.  We will augment prep with using Zofran as needed for nausea and vomiting with drinking the solution.  PLAN   Request prior colonoscopy records from [facility] for prior colonoscopy records and pathology.  Proceed with colonoscopy with propofol by Dr. Abbey Chatters in near future: the risks, benefits, and alternatives have been discussed with the patient in detail. The patient states understanding and desires to proceed. ASA 3 Hold iron for 7 days prior Zofran 4 mg ODT  every 8 hours as needed for nausea/vomiting with colon prep.  Follow up as needed.    Venetia Night, MSN, FNP-BC, AGACNP-BC [facility]

## 2023-02-19 NOTE — Telephone Encounter (Signed)
Please request prior colonoscopy records from [facility].  Venetia Night, MSN, APRN, FNP-BC, AGACNP-BC [facility]

## 2023-02-19 NOTE — Patient Instructions (Addendum)
Please fill out a records request form for [place] at the front desk before you leave so we can request your prior colonoscopy records.  We will be in touch to schedule a colonoscopy in the near future with Dr. Abbey Chatters. You may use your Zofran 4 mg every 8 hours while doing her colon prep to avoid nausea and vomiting.  Follow-up post procedure as needed.  It was a pleasure to see you today. I want to create trusting relationships with patients. If you receive a survey regarding your visit,  I greatly appreciate you taking time to fill this out on paper or through your MyChart. I value your feedback.  Venetia Night, MSN, FNP-BC, AGACNP-BC [facility]

## 2023-02-20 ENCOUNTER — Other Ambulatory Visit: Payer: Self-pay | Admitting: Adult Health

## 2023-02-20 ENCOUNTER — Telehealth: Payer: Self-pay | Admitting: *Deleted

## 2023-02-20 DIAGNOSIS — E041 Nontoxic single thyroid nodule: Secondary | ICD-10-CM

## 2023-02-20 NOTE — Telephone Encounter (Signed)
[facility]  TCS w/Dr.Carver, ASA 3,history of polyps Hold Iron x 7 days

## 2023-02-21 ENCOUNTER — Encounter: Payer: Self-pay | Admitting: *Deleted

## 2023-02-21 ENCOUNTER — Other Ambulatory Visit (HOSPITAL_COMMUNITY): Payer: Self-pay | Admitting: Adult Health

## 2023-02-21 ENCOUNTER — Other Ambulatory Visit: Payer: Self-pay | Admitting: *Deleted

## 2023-02-21 DIAGNOSIS — E041 Nontoxic single thyroid nodule: Secondary | ICD-10-CM

## 2023-02-21 MED ORDER — PEG 3350-KCL-NA BICARB-NACL 420 G PO SOLR: 4000.0000 mL | Freq: Once | ORAL | 0 refills | Status: AC

## 2023-02-21 NOTE — Telephone Encounter (Signed)
Pt left vm wanting to schedule procedure.  [place]

## 2023-02-21 NOTE — Telephone Encounter (Signed)
Pt has been scheduled for 03/26/23. Instructions mailed and prep sent to the pharmacy.   [employer] PA: Approved Authorization [id number]  Tracking [id number] Dates of service 03/26/2023 - 06/22/2023

## 2023-02-28 ENCOUNTER — Encounter (HOSPITAL_COMMUNITY): Payer: Self-pay

## 2023-02-28 ENCOUNTER — Ambulatory Visit (HOSPITAL_COMMUNITY)
Admission: RE | Admit: 2023-02-28 | Discharge: 2023-02-28 | Disposition: A | Discharge status: Home / Self Care | Payer: Medicare HMO | Source: Ambulatory Visit | Attending: Adult Health | Admitting: Adult Health

## 2023-02-28 DIAGNOSIS — E041 Nontoxic single thyroid nodule: Secondary | ICD-10-CM | POA: Diagnosis present

## 2023-02-28 MED ORDER — LIDOCAINE HCL (PF) 2 % IJ SOLN
INTRAMUSCULAR | Status: AC
  Filled 2023-02-28: qty 10

## 2023-02-28 NOTE — Progress Notes (Signed)
Thyroid Biopsy Complete no sign of distress.

## 2023-03-01 LAB — CYTOLOGY - NON PAP

## 2023-03-12 ENCOUNTER — Encounter (HOSPITAL_COMMUNITY): Payer: Self-pay

## 2023-03-22 ENCOUNTER — Encounter (HOSPITAL_COMMUNITY)
Admission: RE | Admit: 2023-03-22 | Discharge: 2023-03-22 | Disposition: A | Discharge status: Home / Self Care | Payer: Medicare HMO | Source: Ambulatory Visit | Attending: Internal Medicine | Admitting: Internal Medicine

## 2023-03-22 ENCOUNTER — Encounter (HOSPITAL_COMMUNITY): Payer: Self-pay

## 2023-03-22 HISTORY — DX: Emphysema, unspecified: J43.9

## 2023-03-22 NOTE — Patient Instructions (Signed)
Claudia Lewis  03/22/2023     @   Your procedure is scheduled on 03/26/2023.  [facility] to [facility] at 12:15 PM  Call this number if you have problems the morning of surgery:  [phone]  If you experience any cold or flu symptoms such as cough, fever, chills, shortness of breath, etc. between now and your scheduled surgery, please notify us at the above number.   Remember:   Please follow the diet and prep intructions given to you by Dr Queen Blossom office.     Take these medicines the morning of surgery with A SIP OF WATER : Cymbalta Isosorbide Levothyroxine Methotrexate Pantoprazole    Do not wear jewelry, make-up or nail polish.  Do not wear lotions, powders, or perfumes, or deodorant.  Do not shave 48 hours prior to surgery.  Men may shave face and neck.  Do not bring valuables to the hospital.  [facility] is not responsible for any belongings or valuables.  Contacts, dentures or bridgework may not be worn into surgery.  Leave your suitcase in the car.  After surgery it may be brought to your room.  For patients admitted to the hospital, discharge time will be determined by your treatment team.  Patients discharged the day of surgery will not be allowed to drive home.   Name and phone number of your driver:   Family Special instructions:  N/A  Please read over the following fact sheets that you were given. Care and Recovery After Surgery   Colonoscopy, Adult A colonoscopy is a procedure to look at the entire large intestine. This procedure is done using a long, thin, flexible tube that has a camera on the end. You may have a colonoscopy: As a part of normal colorectal screening. If you have certain symptoms, such as: A low number of red blood cells in your blood (anemia). Diarrhea that does not go away. Pain in your abdomen. Blood in your stool. A colonoscopy can help screen for and diagnose medical problems, including: An abnormal growth of  cells or tissue (tumor). Abnormal growths within the lining of your intestine (polyps). Inflammation. Areas of bleeding. Tell your health care provider about: Any allergies you have. All medicines you are taking, including vitamins, herbs, eye drops, creams, and over-the-counter medicines. Any problems you or family members have had with anesthetic medicines. Any bleeding problems you have. Any surgeries you have had. Any medical conditions you have. Any problems you have had with having bowel movements. Whether you are pregnant or may be pregnant. What are the risks? Generally, this is a safe procedure. However, problems may occur, including: Bleeding. Damage to your intestine. Allergic reactions to medicines given during the procedure. Infection. This is rare. What happens before the procedure? Eating and drinking restrictions Follow instructions from your health care provider about eating or drinking restrictions, which may include: A few days before the procedure: Follow a low-fiber diet. Avoid nuts, seeds, dried fruit, raw fruits, and vegetables. 1-3 days before the procedure: Eat only gelatin dessert or ice pops. Drink only clear liquids, such as water, clear juice, clear broth or bouillon, black coffee or tea, or clear soft drinks or sports drinks. Avoid liquids that contain red or purple dye. The day of the procedure: Do not eat solid foods. You may continue to drink clear liquids until up to 2 hours before the procedure. Do not eat or drink anything starting 2 hours before the procedure, or within the time period that  your health care provider recommends. Bowel prep If you were prescribed a bowel prep to take by mouth (orally) to clean out your colon: Take it as told by your health care provider. Starting the day before your procedure, you will need to drink a large amount of liquid medicine. The liquid will cause you to have many bowel movements of loose stool until your  stool becomes almost clear or light green. If your skin or the opening between the buttocks (anus) gets irritated from diarrhea, you may relieve the irritation using: Wipes with medicine in them, such as adult wet wipes with aloe and vitamin E. A product to soothe skin, such as petroleum jelly. If you vomit while drinking the bowel prep: Take a break for up to 60 minutes. Begin the bowel prep again. Call your health care provider if you keep vomiting or you cannot take the bowel prep without vomiting. To clean out your colon, you may also be given: Laxative medicines. These help you have a bowel movement. Instructions for enema use. An enema is liquid medicine injected into your rectum. Medicines Ask your health care provider about: Changing or stopping your regular medicines or supplements. This is especially important if you are taking iron supplements, diabetes medicines, or blood thinners. Taking medicines such as aspirin and ibuprofen. These medicines can thin your blood. Do not take these medicines unless your health care provider tells you to take them. Taking over-the-counter medicines, vitamins, herbs, and supplements. General instructions Ask your health care provider what steps will be taken to help prevent infection. These may include washing skin with a germ-killing soap. If you will be going home right after the procedure, plan to have a responsible adult: Take you home from the hospital or clinic. You will not be allowed to drive. Care for you for the time you are told. What happens during the procedure?  An IV will be inserted into one of your veins. You will be given a medicine to make you fall asleep (general anesthetic). You will lie on your side with your knees bent. A lubricant will be put on the tube. Then the tube will be: Inserted into your anus. Gently eased through all parts of your large intestine. Air will be sent into your colon to keep it open. This may  cause some pressure or cramping. Images will be taken with the camera and will appear on a screen. A small tissue sample may be removed to be looked at under a microscope (biopsy). The tissue may be sent to a lab for testing if any signs of problems are found. If small polyps are found, they may be removed and checked for cancer cells. When the procedure is finished, the tube will be removed. The procedure may vary among health care providers and hospitals. What happens after the procedure? Your blood pressure, heart rate, breathing rate, and blood oxygen level will be monitored until you leave the hospital or clinic. You may have a small amount of blood in your stool. You may pass gas and have mild cramping or bloating in your abdomen. This is caused by the air that was used to open your colon during the exam. If you were given a sedative during the procedure, it can affect you for several hours. Do not drive or operate machinery until your health care provider says that it is safe. It is up to you to get the results of your procedure. Ask your health care provider, or the department that  is doing the procedure, when your results will be ready. Summary A colonoscopy is a procedure to look at the entire large intestine. Follow instructions from your health care provider about eating and drinking before the procedure. If you were prescribed an oral bowel prep to clean out your colon, take it as told by your health care provider. During the colonoscopy, a flexible tube with a camera on its end is inserted into the anus and then passed into all parts of the large intestine. This information is not intended to replace advice given to you by your health care provider. Make sure you discuss any questions you have with your health care provider. Document Revised: 11/07/2021 Document Reviewed: 07/06/2021 Elsevier Patient Education  [place] Anesthesia refers to the  techniques, procedures, and medicines that help a person stay safe and comfortable during surgery. Monitored anesthesia care, or sedation, is one type of anesthesia. You may have sedation if you do not need to be asleep for your procedure. Procedures that use sedation may include: Surgery to remove cataracts from your eyes. A dental procedure. A biopsy. This is when a tissue sample is removed and looked at under a microscope. You will be watched closely during your procedure. Your level of sedation or type of anesthesia may be changed to fit your needs. Tell a health care provider about: Any allergies you have. All medicines you are taking, including vitamins, herbs, eye drops, creams, and over-the-counter medicines. Any problems you or family members have had with anesthesia. Any bleeding problems you have. Any surgeries you have had. Any medical conditions or illnesses you have. This includes sleep apnea, cough, fever, or the flu. Whether you are pregnant or may be pregnant. Whether you use cigarettes, alcohol, or drugs. Any use of steroids, whether by mouth or as a cream. What are the risks? Your health care provider will talk with you about risks. These may include: Getting too much medicine (oversedation). Nausea. Allergic reactions to medicines. Trouble breathing. If this happens, a breathing tube may be used to help you breathe. It will be removed when you are awake and breathing on your own. Heart trouble. Lung trouble. Confusion that gets better with time (emergence delirium). What happens before the procedure? When to stop eating and drinking Follow instructions from your health care provider about what you may eat and drink. These may include: 8 hours before your procedure Stop eating most foods. Do not eat meat, fried foods, or fatty foods. Eat only light foods, such as toast or crackers. All liquids are okay except energy drinks and alcohol. 6 hours before your  procedure Stop eating. Drink only clear liquids, such as water, clear fruit juice, black coffee, plain tea, and sports drinks. Do not drink energy drinks or alcohol. 2 hours before your procedure Stop drinking all liquids. You may be allowed to take medicines with small sips of water. If you do not follow your health care provider's instructions, your procedure may be delayed or canceled. Medicines Ask your health care provider about: Changing or stopping your regular medicines. These include any diabetes medicines or blood thinners you take. Taking medicines such as aspirin and ibuprofen. These medicines can thin your blood. Do not take them unless your health care provider tells you to. Taking over-the-counter medicines, vitamins, herbs, and supplements. Testing You may have an exam or testing. You may have a blood or urine sample taken. General instructions Do not use any products that contain nicotine  or tobacco for at least 4 weeks before the procedure. These products include cigarettes, chewing tobacco, and vaping devices, such as e-cigarettes. If you need help quitting, ask your health care provider. If you will be going home right after the procedure, plan to have a responsible adult: Take you home from the hospital or clinic. You will not be allowed to drive. Care for you for the time you are told. What happens during the procedure?  Your blood pressure, heart rate, breathing, level of pain, and blood oxygen level will be monitored. An IV will be inserted into one of your veins. You may be given: A sedative. This helps you relax. Anesthesia. This will: Numb certain areas of your body. Make you fall asleep for surgery. You will be given medicines as needed to keep you comfortable. The more medicine you are given, the deeper your level of sedation will be. Your level of sedation may be changed to fit your needs. There are three levels of sedation: Mild sedation. At this level,  you may feel awake and relaxed. You will be able to follow directions. Moderate sedation. At this level, you will be sleepy. You may not remember the procedure. Deep sedation. At this level, you will be asleep. You will not remember the procedure. How you get the medicines will depend on your age and the procedure. They may be given as: A pill. This may be taken by mouth (orally) or inserted into the rectum. An injection. This may be into a vein or muscle. A spray through the nose. After your procedure is over, the medicine will be stopped. The procedure may vary among health care providers and hospitals. What happens after the procedure? Your blood pressure, heart rate, breathing rate, and blood oxygen level will be monitored until you leave the hospital or clinic. You may feel sleepy, clumsy, or nauseous. You may not remember what happened during or after the procedure. Sedation can affect you for several hours. Do not drive or use machinery until your health care provider says that it is safe. This information is not intended to replace advice given to you by your health care provider. Make sure you discuss any questions you have with your health care provider. Document Revised: 04/10/2022 Document Reviewed: 04/10/2022 Elsevier Patient Education  2023 [employer].

## 2023-03-26 ENCOUNTER — Encounter (HOSPITAL_COMMUNITY)
Admission: RE | Disposition: A | Discharge status: Home / Self Care | Payer: Self-pay | Source: Home / Self Care | Attending: Internal Medicine

## 2023-03-26 ENCOUNTER — Ambulatory Visit (HOSPITAL_COMMUNITY)
Admission: RE | Admit: 2023-03-26 | Discharge: 2023-03-26 | Disposition: A | Discharge status: Home / Self Care | Payer: Medicare HMO | Attending: Internal Medicine | Admitting: Internal Medicine

## 2023-03-26 ENCOUNTER — Ambulatory Visit (HOSPITAL_COMMUNITY): Payer: Medicare HMO | Admitting: Certified Registered Nurse Anesthetist

## 2023-03-26 ENCOUNTER — Ambulatory Visit (HOSPITAL_BASED_OUTPATIENT_CLINIC_OR_DEPARTMENT_OTHER): Payer: Medicare HMO | Admitting: Certified Registered Nurse Anesthetist

## 2023-03-26 DIAGNOSIS — K648 Other hemorrhoids: Secondary | ICD-10-CM

## 2023-03-26 DIAGNOSIS — K5730 Diverticulosis of large intestine without perforation or abscess without bleeding: Secondary | ICD-10-CM

## 2023-03-26 DIAGNOSIS — Z87891 Personal history of nicotine dependence: Secondary | ICD-10-CM | POA: Diagnosis not present

## 2023-03-26 DIAGNOSIS — J449 Chronic obstructive pulmonary disease, unspecified: Secondary | ICD-10-CM | POA: Diagnosis not present

## 2023-03-26 DIAGNOSIS — Z86010 Personal history of colonic polyps: Secondary | ICD-10-CM

## 2023-03-26 DIAGNOSIS — I252 Old myocardial infarction: Secondary | ICD-10-CM | POA: Diagnosis not present

## 2023-03-26 DIAGNOSIS — D122 Benign neoplasm of ascending colon: Secondary | ICD-10-CM | POA: Diagnosis not present

## 2023-03-26 DIAGNOSIS — Z09 Encounter for follow-up examination after completed treatment for conditions other than malignant neoplasm: Secondary | ICD-10-CM | POA: Insufficient documentation

## 2023-03-26 DIAGNOSIS — K649 Unspecified hemorrhoids: Secondary | ICD-10-CM | POA: Diagnosis not present

## 2023-03-26 DIAGNOSIS — D126 Benign neoplasm of colon, unspecified: Secondary | ICD-10-CM

## 2023-03-26 DIAGNOSIS — D125 Benign neoplasm of sigmoid colon: Secondary | ICD-10-CM

## 2023-03-26 DIAGNOSIS — Z1211 Encounter for screening for malignant neoplasm of colon: Secondary | ICD-10-CM | POA: Insufficient documentation

## 2023-03-26 HISTORY — PX: COLONOSCOPY WITH PROPOFOL: SHX5780

## 2023-03-26 HISTORY — PX: POLYPECTOMY: SHX5525

## 2023-03-26 HISTORY — PX: BIOPSY: SHX5522

## 2023-03-26 SURGERY — COLONOSCOPY WITH PROPOFOL
Anesthesia: General

## 2023-03-26 MED ORDER — LACTATED RINGERS IV SOLN: INTRAVENOUS | Status: DC

## 2023-03-26 MED ORDER — LIDOCAINE 2% (20 MG/ML) 5 ML SYRINGE
INTRAMUSCULAR | Status: DC | PRN
  Administered 2023-03-26: 50 mg via INTRAVENOUS

## 2023-03-26 MED ORDER — PROPOFOL 500 MG/50ML IV EMUL
INTRAVENOUS | Status: DC | PRN
  Administered 2023-03-26: 175 ug/kg/min via INTRAVENOUS

## 2023-03-26 MED ORDER — PROPOFOL 10 MG/ML IV BOLUS
INTRAVENOUS | Status: DC | PRN
  Administered 2023-03-26: 50 mg via INTRAVENOUS

## 2023-03-26 MED ORDER — PHENYLEPHRINE 80 MCG/ML (10ML) SYRINGE FOR IV PUSH (FOR BLOOD PRESSURE SUPPORT)
PREFILLED_SYRINGE | INTRAVENOUS | Status: DC | PRN
  Administered 2023-03-26 (×4): 40 ug via INTRAVENOUS

## 2023-03-26 MED ORDER — LACTATED RINGERS IV SOLN: INTRAVENOUS | Status: DC | PRN

## 2023-03-26 NOTE — Transfer of Care (Signed)
Immediate Anesthesia Transfer of Care Note  Patient: Claudia Lewis  Procedure(s) Performed: COLONOSCOPY WITH PROPOFOL POLYPECTOMY BIOPSY  Patient Location: PACU  Anesthesia Type:General  Level of Consciousness: sedated  Airway & Oxygen Therapy: Patient Spontanous Breathing  Post-op Assessment: Report given to RN and Post -op Vital signs reviewed and stable  Post vital signs: Reviewed and stable  Last Vitals:  Vitals Value Taken Time  BP    Temp    Pulse    Resp    SpO2      Last Pain:  Vitals:   03/26/23 0933  PainSc: 0-No pain         Complications: No notable events documented.

## 2023-03-26 NOTE — Discharge Instructions (Addendum)
  Colonoscopy Discharge Instructions  Read the instructions outlined below and refer to this sheet in the next few weeks. These discharge instructions provide you with general information on caring for yourself after you leave the hospital. Your doctor may also give you specific instructions. While your treatment has been planned according to the most current medical practices available, unavoidable complications occasionally occur.   ACTIVITY You may resume your regular activity, but move at a slower pace for the next 24 hours.  Take frequent rest periods for the next 24 hours.  Walking will help get rid of the air and reduce the bloated feeling in your belly (abdomen).  No driving for 24 hours (because of the medicine (anesthesia) used during the test).   Do not sign any important legal documents or operate any machinery for 24 hours (because of the anesthesia used during the test).  NUTRITION Drink plenty of fluids.  You may resume your normal diet as instructed by your doctor.  Begin with a light meal and progress to your normal diet. Heavy or fried foods are harder to digest and may make you feel sick to your stomach (nauseated).  Avoid alcoholic beverages for 24 hours or as instructed.  MEDICATIONS You may resume your normal medications unless your doctor tells you otherwise.  WHAT YOU CAN EXPECT TODAY Some feelings of bloating in the abdomen.  Passage of more gas than usual.  Spotting of blood in your stool or on the toilet paper.  IF YOU HAD POLYPS REMOVED DURING THE COLONOSCOPY: No aspirin products for 7 days or as instructed.  No alcohol for 7 days or as instructed.  Eat a soft diet for the next 24 hours.  FINDING OUT THE RESULTS OF YOUR TEST Not all test results are available during your visit. If your test results are not back during the visit, make an appointment with your caregiver to find out the results. Do not assume everything is normal if you have not heard from your  caregiver or the medical facility. It is important for you to follow up on all of your test results.  SEEK IMMEDIATE MEDICAL ATTENTION IF: You have more than a spotting of blood in your stool.  Your belly is swollen (abdominal distention).  You are nauseated or vomiting.  You have a temperature over 101.  You have abdominal pain or discomfort that is severe or gets worse throughout the day.   Your colonoscopy revealed 4 polyp(s) which I removed successfully. Await pathology results, my office will contact you. Given your age, I do not think we need to pursue further colonoscopies for surveillance purposes.   You also have diverticulosis and internal hemorrhoids. I would recommend increasing fiber in your diet or adding OTC Benefiber/Metamucil. Be sure to drink at least 4 to 6 glasses of water daily. Follow-up with GI as needed.   I hope you have a great rest of your week!  Hennie Duos. Marletta Lor, D.O. Gastroenterology and Hepatology [facility]

## 2023-03-26 NOTE — H&P (Signed)
Primary Care Physician:  Judd Gaudier, NP Primary Gastroenterologist:  Dr. Marletta Lor  Pre-Procedure History & Physical: HPI:  Claudia Lewis is a 77 y.o. female is here for a colonoscopy to be performed for surveillance purposes, personal history of adenomatous colon polyps in 2019  Past Medical History:  Diagnosis Date   Anemia    a. noted on 11/2017 labs.   Arthritis    Emphysema lung (HCC)    Hyperlipidemia    Hypokalemia    Hypothyroidism    NSTEMI (non-ST elevated myocardial infarction) (HCC)    a. 11/2017 - cath with normal, tortuous arteries, no culprit, EF normal.    Past Surgical History:  Procedure Laterality Date   ABDOMINAL HYSTERECTOMY     inspire  2023   LEFT HEART CATH AND CORONARY ANGIOGRAPHY N/A 12/03/2017   Procedure: LEFT HEART CATH AND CORONARY ANGIOGRAPHY;  Surgeon: Runell Gess, MD;  Location: [facility] INVASIVE [facility];  Service: Cardiovascular;  Laterality: N/A;    Prior to Admission medications   Medication Sig Start Date End Date Taking? Authorizing Provider  acetaminophen (TYLENOL) 500 MG tablet Take 500 mg by mouth every 6 (six) hours as needed for mild pain or moderate pain.    Yes [provider]  aspirin EC 81 MG tablet Take 81 mg by mouth daily. Swallow whole.   Yes [provider]  cholecalciferol (VITAMIN D) 1000 units tablet Take 1,000 Units by mouth daily.    Yes [provider]  DULoxetine (CYMBALTA) 30 MG capsule Take 1 capsule by mouth once daily 04/15/19  Yes [provider]  ferrous sulfate 325 (65 FE) MG EC tablet Take 1 tablet by mouth 2 (two) times daily. 06/25/22  Yes [provider]  folic acid (FOLVITE) 1 MG tablet Take 1 tablet by mouth daily. 04/10/15  Yes [provider]  isosorbide mononitrate (IMDUR) 30 MG 24 hr tablet TAKE 1/2 TABLET ONE TIME DAILY 01/22/23  Yes Jonelle Sidle, MD  levothyroxine (SYNTHROID, LEVOTHROID) 25 MCG tablet Take 50 mcg by mouth daily. 03/30/15  Yes  [provider]  methotrexate (RHEUMATREX) 2.5 MG tablet Take 15 mg by mouth every Sunday. 3 tabs twice daily on Sundays only   Yes [provider]  pantoprazole (PROTONIX) 20 MG tablet Take 1 tablet by mouth daily. 04/15/15  Yes [provider]  triamcinolone cream (KENALOG) 0.1 % Apply 1 Application topically 3 (three) times daily.   Yes [provider]  ondansetron (ZOFRAN) 4 MG tablet Take 1 tablet (4 mg total) by mouth every 8 (eight) hours as needed for nausea or vomiting. 02/19/23   Aida Raider, NP    Allergies as of 02/21/2023   (No Known Allergies)    Family History  Problem Relation Age of Onset   Hypertension Mother     Social History   Socioeconomic History   Marital status: Married    Spouse name: Not on file   Number of children: 3   Years of education: Not on file   Highest education level: Not on file  Occupational History   Not on file  Tobacco Use   Smoking status: Former    Types: Cigarettes    Quit date: 12/03/1988    Years since quitting: 34.3   Smokeless tobacco: Never  Vaping Use   Vaping Use: Never used  Substance and Sexual Activity   Alcohol use: No   Drug use: No   Sexual activity: Not Currently  Other Topics Concern  Not on file  Social History Narrative   Not on file   Social Determinants of Health   Financial Resource Strain: Not on file  Food Insecurity: Not on file  Transportation Needs: Not on file  Physical Activity: Not on file  Stress: Not on file  Social Connections: Not on file  Intimate Partner Violence: Not on file    Review of Systems: See HPI, otherwise negative ROS  Physical Exam: Vital signs in last 24 hours:     General:   Alert,  Well-developed, well-nourished, pleasant and cooperative in NAD Head:  Normocephalic and atraumatic. Eyes:  Sclera clear, no icterus.   Conjunctiva pink. Ears:  Normal auditory acuity. Nose:  No deformity, discharge,  or lesions. Msk:   Symmetrical without gross deformities. Normal posture. Extremities:  Without clubbing or edema. Neurologic:  Alert and  oriented x4;  grossly normal neurologically. Skin:  Intact without significant lesions or rashes. Psych:  Alert and cooperative. Normal mood and affect.  Impression/Plan: Claudia Lewis is here for a colonoscopy to be performed for surveillance purposes, personal history of adenomatous colon polyps in 2019  The risks of the procedure including infection, bleed, or perforation as well as benefits, limitations, alternatives and imponderables have been reviewed with the patient. Questions have been answered. All parties agreeable.

## 2023-03-26 NOTE — Op Note (Signed)
[facility] Patient Name: Claudia Lewis Procedure Date: 03/26/2023 9:28 AM MRN: [id number] Date of Birth: 10/22/1946 Attending MD: Hennie Duos. Marletta Lor , [place], [id number] CSN: [id number] Age: 77 Admit Type: Outpatient Procedure:                Colonoscopy Indications:              Surveillance: Personal history of adenomatous                            polyps on last colonoscopy 5 years ago Providers:                Hennie Duos. Marletta Lor, DO, Tammy Vaught, RN, Lafonda Mosses, Technician Referring MD:              Medicines:                See the Anesthesia note for documentation of the                            administered medications Complications:            No immediate complications. Estimated Blood Loss:     Estimated blood loss was minimal. Procedure:                Pre-Anesthesia Assessment:                           - The anesthesia plan was to use monitored                            anesthesia care (MAC).                           After obtaining informed consent, the colonoscope                            was passed under direct vision. Throughout the                            procedure, the patient's blood pressure, pulse, and                            oxygen saturations were monitored continuously. The                            [id number]-HQ190L ([id number]) scope was introduced through                            the anus and advanced to the the cecum, identified                            by appendiceal orifice and ileocecal valve. The                            colonoscopy was  performed without difficulty. The                            patient tolerated the procedure well. The quality                            of the bowel preparation was evaluated using the                            BBPS [facility] Bowel Preparation Scale) with scores                            of: Right Colon = 3, Transverse Colon = 3 and Left                             Colon = 3 (entire mucosa seen well with no residual                            staining, small fragments of stool or opaque                            liquid). The total BBPS score equals 9. Scope In: 9:37:35 AM Scope Out: 9:58:02 AM Scope Withdrawal Time: 0 hours 13 minutes 19 seconds  Total Procedure Duration: 0 hours 20 minutes 27 seconds  Findings:      Non-bleeding internal hemorrhoids were found during endoscopy.      Many large-mouthed and small-mouthed diverticula were found in the       entire colon.      Two sessile polyps were found in the ascending colon. The polyps were 4       to 5 mm in size. These polyps were removed with a cold snare. Resection       and retrieval were complete.      A 2 mm polyp was found in the ascending colon. The polyp was sessile.       The polyp was removed with a cold biopsy forceps. Resection and       retrieval were complete.      A 4 mm polyp was found in the sigmoid colon. The polyp was sessile. The       polyp was removed with a cold snare. Resection and retrieval were       complete.      The exam was otherwise without abnormality. Impression:               - Non-bleeding internal hemorrhoids.                           - Diverticulosis in the entire examined colon.                           - Two 4 to 5 mm polyps in the ascending colon,                            removed with a cold snare. Resected and retrieved.                           -  One 2 mm polyp in the ascending colon, removed                            with a cold biopsy forceps. Resected and retrieved.                           - One 4 mm polyp in the sigmoid colon, removed with                            a cold snare. Resected and retrieved.                           - The examination was otherwise normal. Moderate Sedation:      Per Anesthesia Care Recommendation:           - Patient has a contact number available for                            emergencies. The signs and  symptoms of potential                            delayed complications were discussed with the                            patient. Return to normal activities tomorrow.                            Written discharge instructions were provided to the                            patient.                           - Resume previous diet.                           - Continue present medications.                           - Await pathology results.                           - No repeat colonoscopy due to age.                           - Return to GI clinic PRN. Procedure Code(s):        --- Professional ---                           [phone], Colonoscopy, flexible; with removal of                            tumor(s), polyp(s), or other lesion(s) by snare                            technique  16109, 59, Colonoscopy, flexible; with biopsy,                            single or multiple Diagnosis Code(s):        --- Professional ---                           Z86.010, Personal history of colonic polyps                           K64.8, Other hemorrhoids                           D12.2, Benign neoplasm of ascending colon                           D12.5, Benign neoplasm of sigmoid colon                           K57.30, Diverticulosis of large intestine without                            perforation or abscess without bleeding CPT copyright 2022 American Medical Association. All rights reserved. The codes documented in this report are preliminary and upon coder review may  be revised to meet current compliance requirements. Hennie Duos. Marletta Lor, DO Hennie Duos. Marletta Lor, DO 03/26/2023 10:02:01 AM This report has been signed electronically. Number of Addenda: 0

## 2023-03-26 NOTE — Anesthesia Preprocedure Evaluation (Signed)
Anesthesia Evaluation  Patient identified by MRN, date of birth, ID band Patient awake    Reviewed: Allergy & Precautions, H&P , NPO status , Patient's Chart, lab work & pertinent test results, reviewed documented beta blocker date and time   Airway Mallampati: II  TM Distance: >3 FB Neck ROM: full    Dental no notable dental hx.    Pulmonary neg pulmonary ROS, COPD, former smoker   Pulmonary exam normal breath sounds clear to auscultation       Cardiovascular Exercise Tolerance: Good + Past MI  negative cardio ROS  Rhythm:regular Rate:Normal     Neuro/Psych negative neurological ROS  negative psych ROS   GI/Hepatic negative GI ROS, Neg liver ROS,,,  Endo/Other  negative endocrine ROSHypothyroidism    Renal/GU negative Renal ROS  negative genitourinary   Musculoskeletal   Abdominal   Peds  Hematology negative hematology ROS (+) Blood dyscrasia, anemia   Anesthesia Other Findings   Reproductive/Obstetrics negative OB ROS                             Anesthesia Physical Anesthesia Plan  ASA: 2  Anesthesia Plan: General   Post-op Pain Management:    Induction:   PONV Risk Score and Plan: Propofol infusion  Airway Management Planned:   Additional Equipment:   Intra-op Plan:   Post-operative Plan:   Informed Consent: I have reviewed the patients History and Physical, chart, labs and discussed the procedure including the risks, benefits and alternatives for the proposed anesthesia with the patient or authorized representative who has indicated his/her understanding and acceptance.     Dental Advisory Given  Plan Discussed with: CRNA  Anesthesia Plan Comments:        Anesthesia Quick Evaluation

## 2023-03-27 LAB — SURGICAL PATHOLOGY

## 2023-03-27 NOTE — Anesthesia Postprocedure Evaluation (Signed)
Anesthesia Post Note  Patient: Claudia Lewis  Procedure(s) Performed: COLONOSCOPY WITH PROPOFOL POLYPECTOMY BIOPSY  Patient location during evaluation: Phase II Anesthesia Type: General Level of consciousness: awake Pain management: pain level controlled Vital Signs Assessment: post-procedure vital signs reviewed and stable Respiratory status: spontaneous breathing and respiratory function stable Cardiovascular status: blood pressure returned to baseline and stable Postop Assessment: no headache and no apparent nausea or vomiting Anesthetic complications: no Comments: Late entry   No notable events documented.   Last Vitals:  Vitals:   03/26/23 1002 03/26/23 1006  BP: (!) 95/51 (!) 102/53  Pulse: 80   Resp: 16   Temp: 36.6 C   SpO2: 95%     Last Pain:  Vitals:   03/26/23 1006  TempSrc:   PainSc: 0-No pain                 Windell Norfolk

## 2023-03-29 ENCOUNTER — Encounter (HOSPITAL_COMMUNITY): Payer: Self-pay | Admitting: Internal Medicine

## 2023-04-25 ENCOUNTER — Other Ambulatory Visit: Payer: Self-pay | Admitting: Cardiology

## 2023-10-08 DIAGNOSIS — D508 Other iron deficiency anemias: Secondary | ICD-10-CM | POA: Insufficient documentation

## 2024-02-18 ENCOUNTER — Ambulatory Visit: Payer: Medicare HMO | Attending: Cardiology | Admitting: Cardiology

## 2024-02-18 ENCOUNTER — Encounter: Payer: Self-pay | Admitting: Cardiology

## 2024-02-18 VITALS — BP 132/68 | HR 82 | Ht 66.0 in | Wt 197.2 lb

## 2024-02-18 DIAGNOSIS — G4733 Obstructive sleep apnea (adult) (pediatric): Secondary | ICD-10-CM

## 2024-02-18 DIAGNOSIS — I201 Angina pectoris with documented spasm: Secondary | ICD-10-CM

## 2024-02-18 DIAGNOSIS — R0602 Shortness of breath: Secondary | ICD-10-CM | POA: Diagnosis not present

## 2024-02-18 NOTE — Patient Instructions (Signed)
 Medication Instructions:   Your physician recommends that you continue on your current medications as directed. Please refer to the Current Medication list given to you today.   Labwork: None today  Testing/Procedures: Your physician has requested that you have an echocardiogram. Echocardiography is a painless test that uses sound waves to create images of your heart. It provides your doctor with information about the size and shape of your heart and how well your heart's chambers and valves are working. This procedure takes approximately one hour. There are no restrictions for this procedure. Please do NOT wear cologne, perfume, aftershave, or lotions (deodorant is allowed). Please arrive 15 minutes prior to your appointment time.  Please note: We ask at that you not bring children with you during ultrasound (echo/ vascular) testing. Due to room size and safety concerns, children are not allowed in the ultrasound rooms during exams. Our front office staff cannot provide observation of children in our lobby area while testing is being conducted. An adult accompanying a patient to their appointment will only be allowed in the ultrasound room at the discretion of the ultrasound technician under special circumstances. We apologize for any inconvenience.   Follow-Up: 1 year  Any Other Special Instructions Will Be Listed Below (If Applicable).  If you need a refill on your cardiac medications before your next appointment, please call your pharmacy.

## 2024-02-18 NOTE — Progress Notes (Signed)
    Cardiology Office Note  Date: 02/18/2024   ID: Alli, Jasmer Dec 06, 1945, MRN [id number]  History of Present Illness: Claudia Lewis is a 78 y.o. female last seen in March 2024.  She is here for a follow-up visit.  She does not report any exertional chest pain, does state that she has had more noticeable shortness of breath with activity.  No palpitations or syncope.  Reports chronic arthritic pains with rheumatoid arthritis.  We went over her medications today.  Cardiac regimen includes aspirin and Imdur as before.  She continues to follow regularly with PCP.  I reviewed her most recent lab work.  I reviewed her ECG today which shows sinus rhythm with LVH.  Physical Exam: VS:  BP 132/68   Pulse 82   Ht 5\' 6"  (1.676 m)   Wt 197 lb 3.2 oz (89.4 kg)   SpO2 96%   BMI 31.83 kg/m , BMI Body mass index is 31.83 kg/m.  Wt Readings from Last 3 Encounters:  02/18/24 197 lb 3.2 oz (89.4 kg)  02/19/23 196 lb 6.4 oz (89.1 kg)  02/08/23 197 lb (89.4 kg)    General: Patient appears comfortable at rest. HEENT: Conjunctiva and lids normal. Neck: Supple, no elevated JVP or carotid bruits. Lungs: Clear to auscultation, nonlabored breathing at rest. Cardiac: Regular rate and rhythm, no S3 or significant systolic murmur. Extremities: No pitting edema.  ECG:  An ECG dated 02/08/2023 was personally reviewed today and demonstrated:  Sinus rhythm followed by ectopic atrial rhythm.  Labwork:  November 2024: Hemoglobin 11.3, platelets 278  Other Studies Reviewed Today:  No interval cardiac testing for review today.  Assessment and Plan:  1.  History of NSTEMI in the setting of suspected coronary vasospasm with no obstructive CAD at cardiac catheterization in January 2019.  She does not report any angina, does describe dyspnea on exertion as discussed above.  ECG reviewed and stable.  We will plan a follow-up echocardiogram to evaluate cardiac structure and function.  Otherwise continue  aspirin 81 mg daily and Imdur 15 mg daily.   2.  OSA using Inspire.  Disposition:  Follow up  test results.  Signed, Jonelle Sidle, M.D., F.A.C.C. [facility]

## 2024-03-06 ENCOUNTER — Ambulatory Visit (HOSPITAL_COMMUNITY)
Admission: RE | Admit: 2024-03-06 | Discharge: 2024-03-06 | Disposition: A | Discharge status: Home / Self Care | Source: Ambulatory Visit | Attending: Cardiology | Admitting: Cardiology

## 2024-03-06 DIAGNOSIS — R0602 Shortness of breath: Secondary | ICD-10-CM | POA: Diagnosis present

## 2024-03-06 LAB — ECHOCARDIOGRAM COMPLETE
AR max vel: 2.47 cm2
AV Area VTI: 2.6 cm2
AV Area mean vel: 2.42 cm2
AV Mean grad: 3 mmHg
AV Peak grad: 5.4 mmHg
Ao pk vel: 1.16 m/s
Area-P 1/2: 4.24 cm2
S' Lateral: 3.1 cm

## 2024-03-25 DIAGNOSIS — D500 Iron deficiency anemia secondary to blood loss (chronic): Secondary | ICD-10-CM | POA: Insufficient documentation

## 2024-03-29 ENCOUNTER — Emergency Department (HOSPITAL_COMMUNITY)

## 2024-03-29 ENCOUNTER — Emergency Department (HOSPITAL_COMMUNITY)
Admission: EM | Admit: 2024-03-29 | Discharge: 2024-03-29 | Disposition: A | Discharge status: Home / Self Care | Attending: Emergency Medicine | Admitting: Emergency Medicine

## 2024-03-29 ENCOUNTER — Encounter (HOSPITAL_COMMUNITY): Payer: Self-pay

## 2024-03-29 ENCOUNTER — Other Ambulatory Visit: Payer: Self-pay

## 2024-03-29 DIAGNOSIS — M069 Rheumatoid arthritis, unspecified: Secondary | ICD-10-CM | POA: Insufficient documentation

## 2024-03-29 DIAGNOSIS — M25531 Pain in right wrist: Secondary | ICD-10-CM | POA: Insufficient documentation

## 2024-03-29 DIAGNOSIS — E039 Hypothyroidism, unspecified: Secondary | ICD-10-CM | POA: Diagnosis not present

## 2024-03-29 DIAGNOSIS — Z7982 Long term (current) use of aspirin: Secondary | ICD-10-CM | POA: Diagnosis not present

## 2024-03-29 DIAGNOSIS — Z79899 Other long term (current) drug therapy: Secondary | ICD-10-CM | POA: Insufficient documentation

## 2024-03-29 MED ORDER — OXYCODONE-ACETAMINOPHEN 5-325 MG PO TABS
1.0000 | ORAL_TABLET | Freq: Once | ORAL | Status: AC
  Administered 2024-03-29: 1 via ORAL
  Filled 2024-03-29: qty 1

## 2024-03-29 NOTE — Discharge Instructions (Addendum)
 You were seen today for wrist pain.  This is likely related to your known rheumatoid arthritis.  Take Tylenol  or ibuprofen for pain.  You may wear a splint for comfort.  Follow-up with your primary doctor.

## 2024-03-29 NOTE — ED Provider Notes (Signed)
 [place] EMERGENCY DEPARTMENT AT [facility] Provider Note   CSN: [id number] Arrival date & time: 03/29/24  0211     History  Chief Complaint  Patient presents with   Wrist Pain    Claudia Lewis is a 78 y.o. female.  HPI     This is a 78 year old female who presents with right wrist pain.  Patient reports history of similar pain in the past.  She states that normally it self resolves.  She has a history of rheumatoid arthritis with migratory arthralgias.  She is on methotrexate for disease modification.  She has not had any fevers.  Denies injury.  States she has had 1 week progressive swelling and pain of the right wrist.  Home Medications Prior to Admission medications   Medication Sig Start Date End Date Taking? Authorizing Provider  acetaminophen  (TYLENOL ) 500 MG tablet Take 500 mg by mouth every 6 (six) hours as needed for mild pain or moderate pain.     [provider]  aspirin  EC 81 MG tablet Take 81 mg by mouth daily. Swallow whole.    [provider]  cholecalciferol (VITAMIN D) 1000 units tablet Take 1,000 Units by mouth daily.     [provider]  DULoxetine (CYMBALTA) 30 MG capsule Take 1 capsule by mouth once daily 04/15/19   [provider]  ferrous sulfate 325 (65 FE) MG EC tablet Take 1 tablet by mouth daily with breakfast. 06/25/22   [provider]  folic acid (FOLVITE) 1 MG tablet Take 1 tablet by mouth daily. 04/10/15   [provider]  isosorbide  mononitrate (IMDUR ) 30 MG 24 hr tablet TAKE 1/2 TABLET ONE TIME DAILY 04/25/23   Gerard Knight, MD  levothyroxine  (SYNTHROID , LEVOTHROID) 25 MCG tablet Take 50 mcg by mouth daily. 03/30/15   [provider]  methotrexate (RHEUMATREX) 2.5 MG tablet Take 15 mg by mouth every Sunday. 3 tabs twice daily on Sundays only    [provider]  pantoprazole  (PROTONIX ) 20 MG tablet Take 1 tablet by mouth daily. 04/15/15   [provider]       Allergies    Patient has no known allergies.    Review of Systems   Review of Systems  Constitutional:  Negative for fever.  Musculoskeletal:  Positive for joint swelling.       Wrist pain  All other systems reviewed and are negative.   Physical Exam Updated Vital Signs BP (!) 152/67 (BP Location: Left Arm)   Pulse 89   Temp 98.9 F (37.2 C)   Resp 19   Ht 1.676 m (5\' 6" )   Wt 89.4 kg   SpO2 92%   BMI 31.81 kg/m  Physical Exam Vitals and nursing note reviewed.  Constitutional:      Appearance: She is well-developed. She is not ill-appearing.     Comments: Elderly, nontoxic-appearing  HENT:     Head: Normocephalic and atraumatic.  Eyes:     Pupils: Pupils are equal, round, and reactive to light.  Cardiovascular:     Rate and Rhythm: Normal rate and regular rhythm.  Pulmonary:     Effort: Pulmonary effort is normal. No respiratory distress.  Abdominal:     Palpations: Abdomen is soft.  Musculoskeletal:     Cervical back: Neck supple.     Comments: Tenderness palpation right wrist with limited range of motion secondary to pain, no overlying skin changes or erythema, 2+ radial pulse, flexion extension of all 5  digits of the hand intact  Skin:    General: Skin is warm and dry.  Neurological:     Mental Status: She is alert and oriented to person, place, and time.  Psychiatric:        Mood and Affect: Mood normal.     ED Results / Procedures / Treatments   Labs (all labs ordered are listed, but only abnormal results are displayed) Labs Reviewed - No data to display  EKG None  Radiology DG Wrist Complete Right Result Date: 03/29/2024 CLINICAL DATA:  Swelling in the right wrist and pain. EXAM: RIGHT WRIST - COMPLETE 3+ VIEW COMPARISON:  None Available. FINDINGS: Swelling about the right wrist.  No acute fracture or dislocation. IMPRESSION: Swelling about the right wrist without acute fracture. Electronically Signed   By: Rozell Cornet M.D.   On: 03/29/2024  03:19    Procedures Procedures    Medications Ordered in ED Medications  oxyCODONE-acetaminophen  (PERCOCET/ROXICET) 5-325 MG per tablet 1 tablet (1 tablet Oral Given 03/29/24 0310)    ED Course/ Medical Decision Making/ A&P                                 Medical Decision Making Amount and/or Complexity of Data Reviewed Radiology: ordered.  Risk Prescription drug management.   This patient presents to the ED for concern of wrist pain, this involves an extensive number of treatment options, and is a complaint that carries with it a high risk of complications and morbidity.  I considered the following differential and admission for this acute, potentially life threatening condition.  The differential diagnosis includes sprain, fracture, rheumatoid arthritis, septic arthritis  MDM:    This is a 78 year old who presents with atraumatic right wrist pain.  She is nontoxic.  Vital signs notable for blood pressure 152/67.  Pain has been indolent in over the last week.  She also reports a history of similar pain.  Patient was given Percocet.  X-ray only notable for soft tissue swelling.  Low suspicion for septic arthritis as patient is well-appearing and afebrile.  Given her history would suspect related to her known rheumatoid arthritis.  Patient was provided with a splint for comfort.  Recommend Tylenol  or ibuprofen for ongoing pain.  (Labs, imaging, consults)  Labs: I Ordered, and personally interpreted labs.  The pertinent results include: N/A  Imaging Studies ordered: I ordered imaging studies including x-ray right wrist I independently visualized and interpreted imaging. I agree with the radiologist interpretation  Additional history obtained from chart review.  External records from outside source obtained and reviewed including prior evaluations  Cardiac Monitoring: The patient was maintained on a cardiac monitor.  If on the cardiac monitor, I personally viewed and interpreted  the cardiac monitored which showed an underlying rhythm of: Sinus  Reevaluation: After the interventions noted above, I reevaluated the patient and found that they have :stayed the same  Social Determinants of Health:  lives independently  Disposition: Discharge  Co morbidities that complicate the patient evaluation  Past Medical History:  Diagnosis Date   Anemia    a. noted on 11/2017 labs.   Arthritis    Emphysema lung (HCC)    Hyperlipidemia    Hypokalemia    Hypothyroidism    NSTEMI (non-ST elevated myocardial infarction) (HCC)    a. 11/2017 - cath with normal, tortuous arteries, no culprit, EF normal.     Medicines Meds ordered this encounter  Medications   oxyCODONE-acetaminophen  (PERCOCET/ROXICET) 5-325 MG per tablet 1 tablet    Refill:  0    I have reviewed the patients home medicines and have made adjustments as needed  Problem List / ED Course: Problem List Items Addressed This Visit   None Visit Diagnoses       Right wrist pain    -  Primary     Rheumatoid arthritis involving right wrist, unspecified whether rheumatoid factor present (HCC)       Relevant Medications   oxyCODONE-acetaminophen  (PERCOCET/ROXICET) 5-325 MG per tablet 1 tablet (Completed)                   Final Clinical Impression(s) / ED Diagnoses Final diagnoses:  Right wrist pain  Rheumatoid arthritis involving right wrist, unspecified whether rheumatoid factor present [facility])    Rx / DC Orders ED Discharge Orders     None         Rory Collard, MD 03/29/24 [phone]

## 2024-03-29 NOTE — ED Triage Notes (Signed)
 Pov from home Cc increasing right wrist pain + swelling 1 week Denies injury

## 2024-04-08 ENCOUNTER — Ambulatory Visit: Admitting: Gastroenterology

## 2024-04-29 ENCOUNTER — Encounter: Payer: Self-pay | Admitting: Gastroenterology

## 2024-04-29 ENCOUNTER — Ambulatory Visit (INDEPENDENT_AMBULATORY_CARE_PROVIDER_SITE_OTHER): Admitting: Gastroenterology

## 2024-04-29 ENCOUNTER — Ambulatory Visit: Admitting: Gastroenterology

## 2024-04-29 VITALS — BP 129/75 | HR 94 | Temp 98.6°F | Ht 65.0 in | Wt 192.4 lb

## 2024-04-29 DIAGNOSIS — D509 Iron deficiency anemia, unspecified: Secondary | ICD-10-CM

## 2024-04-29 NOTE — Patient Instructions (Signed)
 We are arranging an upper endoscopy with Dr. Mordechai April in the near future!  Let's hold the oral iron for 7 days prior.  We will see you in follow-up thereafter!  It was a pleasure to see you today. I want to create trusting relationships with patients and provide genuine, compassionate, and quality care. I truly value your feedback, so please be on the lookout for a survey regarding your visit with me today. I appreciate your time in completing this!         Delman Ferns, PhD, ANP-BC [facility] Gastroenterology

## 2024-04-29 NOTE — Progress Notes (Signed)
 Gastroenterology Office Note     Primary Care Physician:  Stuart Bureau, NP  Primary Gastroenterologist: Dr. Mordechai April   Chief Complaint   Chief Complaint  Patient presents with   New Patient (Initial Visit)    Pt referred for anemia     History of Present Illness   Claudia Lewis is a 78 y.o. 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 She has been seen by Hem/Onc and received 1 iron infusion. Last seen by GI here in March 2024 and completed colonoscopy April 2024 with adenomas as noted below.   Most recent Hgb 8.6 on 03/10/24. Iron low at 15, iron sats low at 4, ferritin 41. Dec 2024 Hgb was 11.7.   No melena or hematochezia. Feels fatigued, weak. Pantoprazole  20 mg daily. Gets strangled at times with liquids. No solid food dysphagia. 81 mg aspirin . Takes tylenol  products. Appetite waxes and wanes. Notes early satiety. No abdominal pain. One iron infusion. Will be seeing Hematology in next few weeks.   April 2024 Colonoscopy at [facility]: non-bleeding internal hemorrhoids, diverticulosis, two 4-5 mm polyps in ascending, one 2 mm polyp in ascending, one 4 mm polyp in sigmoid, path with tubular adenoma and hyperplastic polyp. No repeat due to age.   2019 colonoscopy at [facility]: one 2 mm cecal polyp, diverticulosis in sigmoid, descending, and transverse colon, internal hemorrhoids. Path with no significant findings.   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 noted on 11/2017 labs.   Arthritis    Emphysema lung (HCC)    Hyperlipidemia    Hypokalemia    Hypothyroidism    NSTEMI (non-ST elevated myocardial infarction) (HCC)    a. 11/2017 - cath with normal, tortuous arteries, no culprit, EF normal.    Past Surgical History:  Procedure Laterality Date   ABDOMINAL HYSTERECTOMY     BIOPSY  03/26/2023   Procedure:  BIOPSY;  Surgeon: Vinetta Greening, DO;  [facility];  Service: Endoscopy;;  polyp   COLONOSCOPY WITH PROPOFOL  N/A 03/26/2023   Procedure: COLONOSCOPY WITH PROPOFOL ;  Surgeon: Vinetta Greening, DO;  Location: [facility];  Service: Endoscopy;  Laterality: N/A;  2:15 pm   inspire  2023   LEFT HEART CATH AND CORONARY ANGIOGRAPHY N/A 12/03/2017   Procedure: LEFT HEART CATH AND CORONARY ANGIOGRAPHY;  Surgeon: Avanell Leigh, MD;  Location: [facility] INV[facility] CV LAB;  Service: Cardiovascular;  Laterality: N/A;   POLYPECTOMY  03/26/2023   Procedure: POLYPECTOMY;  Surgeon: Vinetta Greening, DO;  Location: [facility];  Service: Endoscopy;;    Current Outpatient Medications  Medication Sig Dispense Refill   acetaminophen  (TYLENOL ) 500 MG tablet Take 500 mg by mouth every 6 (six) hours as needed for mild pain or moderate pain.      aspirin  EC 81 MG tablet Take 81 mg by mouth daily. Swallow whole.     cholecalciferol (VITAMIN D) 1000 units tablet Take 1,000 Units by mouth daily.      DULoxetine (CYMBALTA) 30 MG capsule Take 1 capsule by mouth once daily     ferrous sulfate 325 (65 FE) MG EC tablet Take 1 tablet by mouth daily with breakfast.     folic acid (FOLVITE) 1 MG tablet Take 1 tablet by mouth daily.  11   isosorbide  mononitrate (IMDUR )  30 MG 24 hr tablet TAKE 1/2 TABLET ONE TIME DAILY 45 tablet 3   levothyroxine  (SYNTHROID , LEVOTHROID) 25 MCG tablet Take 50 mcg by mouth daily.  2   methotrexate (RHEUMATREX) 2.5 MG tablet Take 15 mg by mouth every Sunday. 3 tabs twice daily on Sundays only     pantoprazole  (PROTONIX ) 20 MG tablet Take 1 tablet by mouth daily.  1   No current facility-administered medications for this visit.    Allergies as of 04/29/2024   (No Known Allergies)    Family History  Problem Relation Age of Onset   Hypertension Mother     Social History   Socioeconomic History   Marital status: Married    Spouse name: Not on file   Number of children: 3    Years of education: Not on file   Highest education level: Not on file  Occupational History   Not on file  Tobacco Use   Smoking status: Former    Current packs/day: 0.00    Types: Cigarettes    Quit date: 12/03/1988    Years since quitting: 35.4   Smokeless tobacco: Never  Vaping Use   Vaping status: Never Used  Substance and Sexual Activity   Alcohol use: No   Drug use: No   Sexual activity: Not Currently  Other Topics Concern   Not on file  Social History Narrative   Not on file   Social Drivers of Health   Financial Resource Strain: Low Risk  (02/01/2024)   Received from [facility]   Overall Financial Resource Strain (CARDIA)    Difficulty of Paying Living Expenses: Not hard at all  Food Insecurity: No Food Insecurity (04/19/2024)   Hunger Vital Sign    Worried About Running Out of Food in the Last Year: Never true    Ran Out of Food in the Last Year: Never true  Transportation Needs: No Transportation Needs (04/19/2024)   PRAPARE - Administrator, Civil Service (Medical): No    Lack of Transportation (Non-Medical): No  Physical Activity: Unknown (12/31/2022)   [facility]   Exercise Vital Sign    Days of Exercise per Week: 0 days    Minutes of Exercise per Session: Not on file  Recent Concern: Physical Activity - Inactive (12/31/2022)   Received from [facility]   Exercise Vital Sign    Days of Exercise per Week: 0 days    Minutes of Exercise per Session: 0 min  Stress: Stress Concern Present (12/31/2022)   Received from [facility] - Occupational Stress Questionnaire    Feeling of Stress : To some extent  Social Connections: Socially Integrated (12/31/2022)   Received from [facility], [facility]   Social Network    How would you rate your social network (family, work, friends)?: Good participation with social networks  Intimate Partner Violence: Not At Risk  (04/19/2024)   Humiliation, Afraid, Rape, and Kick questionnaire    Fear of Current or Ex-Partner: No    Emotionally Abused: No    Physically Abused: No    Sexually Abused: No     Review of Systems   Gen: Denies any fever, chills, fatigue, weight loss, lack of appetite.  CV: Denies chest pain, heart palpitations, peripheral edema, syncope.  Resp: Denies shortness of breath at rest or with exertion. Denies wheezing or cough.  GI: Denies dysphagia or odynophagia. Denies jaundice, hematemesis, fecal incontinence.  GU : Denies urinary burning, urinary frequency, urinary hesitancy MS: Denies joint pain, muscle weakness, cramps, or limitation of movement.  Derm: Denies rash, itching, dry skin Psych: Denies depression, anxiety, memory loss, and confusion Heme: Denies bruising, bleeding, and enlarged lymph nodes.   Physical Exam   BP 129/75   Pulse 94   Temp 98.6 F (37 C)   Ht 5\' 5"  (1.651 m)   Wt 192 lb 6.4 oz (87.3 kg)   BMI 32.02 kg/m  General:   Alert and oriented. Pleasant and cooperative. Well-nourished and well-developed.  Head:  Normocephalic and atraumatic. Eyes:  Without icterus Abdomen:  +BS, soft, non-tender and non-distended. No HSM noted. No guarding or rebound. No masses appreciated.  Rectal:  Deferred  Msk:  Symmetrical without gross deformities. Normal posture. Extremities:  Without edema. Neurologic:  Alert and  oriented x4;  grossly normal neurologically. Skin:  Intact without significant lesions or rashes. Psych:  Alert and cooperative. Normal mood and affect.   Assessment   Claudia Lewis is a 78 y.o. 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    IDA: Most recent Hgb 8.6, previously 11.7 in Dec 2024. Ferritin 41, iron sats low at 4. She has received 1 IV iron infusion by Hem/Onc and has upcoming visit in near  future. Colonoscopy in 2024 with adenomas. No EGD. No overt GI bleeding. Denies any NSAIDs other than 81 mg aspirin . She will continue pantoprazole  20 mg daily. As no prior EGD, will start with this first. She may ultimately need a capsule study.    Notably, she gets strangled with liquids but no solid food dysphagia. Presentation consistent with oropharyngeal component.      PLAN    Continue PPI daily Hold iron 7 days prior Return in follow-up thereafter Will need capsule study if EGD unrevealing    Delman Ferns, PhD, ANP-BC [facility] Gastroenterology

## 2024-06-23 ENCOUNTER — Encounter: Payer: Self-pay | Admitting: Gastroenterology

## 2024-06-30 NOTE — Progress Notes (Unsigned)
 New Patient Office Visit  Subjective   Patient ID: Claudia Lewis, female    DOB: 08/13/1946  Age: 78 y.o. MRN: [id number]  CC: No chief complaint on file.   HPI Claudia Lewis presents to establish care ***  Outpatient Encounter Medications as of 07/01/2024  Medication Sig   acetaminophen  (TYLENOL ) 500 MG tablet Take 500 mg by mouth every 6 (six) hours as needed for mild pain or moderate pain.    aspirin  EC 81 MG tablet Take 81 mg by mouth daily. Swallow whole.   cholecalciferol (VITAMIN D) 1000 units tablet Take 1,000 Units by mouth daily.    DULoxetine (CYMBALTA) 30 MG capsule Take 1 capsule by mouth once daily   ferrous sulfate 325 (65 FE) MG EC tablet Take 1 tablet by mouth daily with breakfast.   folic acid (FOLVITE) 1 MG tablet Take 1 tablet by mouth daily.   isosorbide  mononitrate (IMDUR ) 30 MG 24 hr tablet TAKE 1/2 TABLET ONE TIME DAILY   levothyroxine  (SYNTHROID , LEVOTHROID) 25 MCG tablet Take 50 mcg by mouth daily.   methotrexate (RHEUMATREX) 2.5 MG tablet Take 15 mg by mouth every Sunday. 3 tabs twice daily on Sundays only   pantoprazole  (PROTONIX ) 20 MG tablet Take 1 tablet by mouth daily.   No facility-administered encounter medications on file as of 07/01/2024.    Past Medical History:  Diagnosis Date   Anemia    a. noted on 11/2017 labs.   Arthritis    Emphysema lung (HCC)    Hyperlipidemia    Hypokalemia    Hypothyroidism    NSTEMI (non-ST elevated myocardial infarction) (HCC)    a. 11/2017 - cath with normal, tortuous arteries, no culprit, EF normal.    Past Surgical History:  Procedure Laterality Date   ABDOMINAL HYSTERECTOMY     BIOPSY  03/26/2023   Procedure: BIOPSY;  Surgeon: Cindie Carlin POUR, DO;  [facility];  Service: Endoscopy;;  polyp   COLONOSCOPY WITH PROPOFOL  N/A 03/26/2023   Procedure: COLONOSCOPY WITH PROPOFOL ;  Surgeon: Cindie Carlin POUR, DO;  [facility];  Service: Endoscopy;  Laterality: N/A;  2:15 pm   inspire   2023   LEFT HEART CATH AND CORONARY ANGIOGRAPHY N/A 12/03/2017   Procedure: LEFT HEART CATH AND CORONARY ANGIOGRAPHY;  Surgeon: Court Dorn PARAS, MD;  [facility];  Service: Cardiovascular;  Laterality: N/A;   POLYPECTOMY  03/26/2023   Procedure: POLYPECTOMY;  Surgeon: Cindie Carlin POUR, DO;  [facility];  Service: Endoscopy;;    Family History  Problem Relation Age of Onset   Hypertension Mother    Colon cancer Neg Hx     Social History   Socioeconomic History   Marital status: Married    Spouse name: Not on file   Number of children: 3   Years of education: Not on file   Highest education level: Not on file  Occupational History   Not on file  Tobacco Use   Smoking status: Former    Current packs/day: 0.00    Types: Cigarettes    Quit date: 12/03/1988    Years since quitting: 35.5   Smokeless tobacco: Never  Vaping Use   Vaping status: Never Used  Substance and Sexual Activity   Alcohol use: No   Drug use: No   Sexual activity: Not Currently  Other Topics Concern   Not on file  Social History Narrative   Not on file   Social Drivers of  Health   Financial Resource Strain: High Risk (05/11/2024)   [facility]   Overall Financial Resource Strain (CARDIA)    Difficulty of Paying Living Expenses: Hard  Food Insecurity: Food Insecurity Present (05/11/2024)   Received from [facility]   Hunger Vital Sign    Within the past 12 months, you worried that your food would run out before you got the money to buy more.: Sometimes true    Within the past 12 months, the food you bought just didn't last and you didn't have money to get more.: Sometimes true  Transportation Needs: No Transportation Needs (05/11/2024)   Received from [facility] - Transportation    Lack of Transportation (Medical): No    Lack of Transportation (Non-Medical): No  Physical Activity: Insufficiently Active (05/11/2024)   Received from [facility]   Exercise Vital Sign    On average, how many days per week do you engage in moderate to strenuous exercise (like a brisk walk)?: 3 days    On average, how many minutes do you engage in exercise at this level?: 10 min  Stress: No Stress Concern Present (05/11/2024)   Received from [facility] - Occupational Stress Questionnaire    Feeling of Stress : Only a little  Social Connections: Socially Integrated (05/11/2024)   Received from [facility]   Social Network    How would you rate your social network (family, work, friends)?: Good participation with social networks  Intimate Partner Violence: Not At Risk (05/11/2024)   Received from [facility]   HITS    Over the last 12 months how often did your partner physically hurt you?: Never    Over the last 12 months how often did your partner insult you or talk down to you?: Sometimes    Over the last 12 months how often did your partner threaten you with physical harm?: Never    Over the last 12 months how often did your partner scream or curse at you?: Sometimes    ROS Negative unless indicated in HPI    Objective   There were no vitals taken for this visit.  Physical Exam  Last CBC Lab Results  Component Value Date   WBC 5.0 07/03/2018   HGB 12.6 07/03/2018   HCT 37.0 07/03/2018   MCV 86.6 07/03/2018   MCH 27.7 07/03/2018   RDW 16.4 (H) 07/03/2018   PLT 342 07/03/2018   Last metabolic panel Lab Results  Component Value Date   GLUCOSE 103 (H) 07/03/2018   NA 142 07/03/2018   K 3.6 07/03/2018   CL 104 07/03/2018   CO2 25 07/03/2018   BUN 11 07/03/2018   CREATININE 0.70 07/03/2018   GFRNONAA >60 07/03/2018   CALCIUM  9.1 07/03/2018   PROT 8.2 (H) 07/03/2018   ALBUMIN 4.2 07/03/2018   BILITOT 0.6 07/03/2018   ALKPHOS 71 07/03/2018   AST 23 07/03/2018   ALT 27 07/03/2018   ANIONGAP 8 07/03/2018    Last thyroid  functions Lab Results  Component Value Date    TSH 6.167 (H) 12/19/2017        Assessment & Plan:  There are no diagnoses linked to this encounter.  No follow-ups on file.   @Lenna Hagarty  Sebastian DNP@  Note: This document was prepared by [employer] and any errors that results from this process are unintentional.

## 2024-07-01 ENCOUNTER — Ambulatory Visit (INDEPENDENT_AMBULATORY_CARE_PROVIDER_SITE_OTHER): Payer: Self-pay | Admitting: Nurse Practitioner

## 2024-07-01 ENCOUNTER — Ambulatory Visit (INDEPENDENT_AMBULATORY_CARE_PROVIDER_SITE_OTHER)

## 2024-07-01 ENCOUNTER — Encounter: Payer: Self-pay | Admitting: Nurse Practitioner

## 2024-07-01 ENCOUNTER — Other Ambulatory Visit: Payer: Self-pay | Admitting: Cardiology

## 2024-07-01 VITALS — BP 106/63 | HR 83 | Temp 97.0°F | Ht 65.0 in | Wt 197.6 lb

## 2024-07-01 DIAGNOSIS — F330 Major depressive disorder, recurrent, mild: Secondary | ICD-10-CM | POA: Diagnosis not present

## 2024-07-01 DIAGNOSIS — E039 Hypothyroidism, unspecified: Secondary | ICD-10-CM | POA: Insufficient documentation

## 2024-07-01 DIAGNOSIS — Z780 Asymptomatic menopausal state: Secondary | ICD-10-CM | POA: Diagnosis not present

## 2024-07-01 DIAGNOSIS — Z0001 Encounter for general adult medical examination with abnormal findings: Secondary | ICD-10-CM

## 2024-07-01 DIAGNOSIS — E66811 Obesity, class 1: Secondary | ICD-10-CM

## 2024-07-01 DIAGNOSIS — K219 Gastro-esophageal reflux disease without esophagitis: Secondary | ICD-10-CM | POA: Diagnosis not present

## 2024-07-01 DIAGNOSIS — Z6832 Body mass index (BMI) 32.0-32.9, adult: Secondary | ICD-10-CM | POA: Insufficient documentation

## 2024-07-01 DIAGNOSIS — D509 Iron deficiency anemia, unspecified: Secondary | ICD-10-CM

## 2024-07-01 LAB — LIPID PANEL

## 2024-07-02 ENCOUNTER — Other Ambulatory Visit: Payer: Self-pay | Admitting: Nurse Practitioner

## 2024-07-02 ENCOUNTER — Ambulatory Visit: Payer: Self-pay | Admitting: Nurse Practitioner

## 2024-07-02 DIAGNOSIS — R7309 Other abnormal glucose: Secondary | ICD-10-CM

## 2024-07-02 LAB — CBC WITH DIFFERENTIAL/PLATELET
Basophils Absolute: 0 x10E3/uL (ref 0.0–0.2)
Basos: 1 %
EOS (ABSOLUTE): 0.1 x10E3/uL (ref 0.0–0.4)
Eos: 4 %
Hematocrit: 35.4 % (ref 34.0–46.6)
Hemoglobin: 10.7 g/dL — ABNORMAL LOW (ref 11.1–15.9)
Immature Grans (Abs): 0 x10E3/uL (ref 0.0–0.1)
Immature Granulocytes: 0 %
Lymphocytes Absolute: 1.2 x10E3/uL (ref 0.7–3.1)
Lymphs: 36 %
MCH: 26.8 pg (ref 26.6–33.0)
MCHC: 30.2 g/dL — ABNORMAL LOW (ref 31.5–35.7)
MCV: 89 fL (ref 79–97)
Monocytes Absolute: 0.4 x10E3/uL (ref 0.1–0.9)
Monocytes: 12 %
Neutrophils Absolute: 1.5 x10E3/uL (ref 1.4–7.0)
Neutrophils: 47 %
Platelets: 322 x10E3/uL (ref 150–450)
RBC: 4 x10E6/uL (ref 3.77–5.28)
RDW: 17.3 % — ABNORMAL HIGH (ref 11.7–15.4)
WBC: 3.2 x10E3/uL — ABNORMAL LOW (ref 3.4–10.8)

## 2024-07-02 LAB — CMP14+EGFR
ALT: 18 IU/L (ref 0–32)
AST: 22 IU/L (ref 0–40)
Albumin: 4.3 g/dL (ref 3.8–4.8)
Alkaline Phosphatase: 82 IU/L (ref 44–121)
BUN/Creatinine Ratio: 16 (ref 12–28)
BUN: 17 mg/dL (ref 8–27)
Bilirubin Total: <0.2 mg/dL (ref 0.0–1.2)
CO2: 21 mmol/L (ref 20–29)
Calcium: 9.5 mg/dL (ref 8.7–10.3)
Chloride: 105 mmol/L (ref 96–106)
Creatinine, Ser: 1.04 mg/dL — AB (ref 0.57–1.00)
Globulin, Total: 2.7 g/dL (ref 1.5–4.5)
Glucose: 155 mg/dL — AB (ref 70–99)
Potassium: 4.5 mmol/L (ref 3.5–5.2)
Sodium: 141 mmol/L (ref 134–144)
Total Protein: 7 g/dL (ref 6.0–8.5)
eGFR: 56 mL/min/1.73 — AB (ref >59)

## 2024-07-02 LAB — LIPID PANEL
Cholesterol, Total: 145 mg/dL (ref 100–199)
HDL: 34 mg/dL — AB (ref >39)
LDL CALC COMMENT:: 4.3 ratio (ref 0.0–4.4)
LDL Chol Calc (NIH): 76 mg/dL (ref 0–99)
Triglycerides: 206 mg/dL — AB (ref 0–149)
VLDL Cholesterol Cal: 35 mg/dL (ref 5–40)

## 2024-07-02 LAB — THYROID PANEL WITH TSH
Free Thyroxine Index: 2 (ref 1.2–4.9)
T3 Uptake Ratio: 25 (ref 24–39)
T4, Total: 7.8 ug/dL (ref 4.5–12.0)
TSH: 2.17 u[IU]/mL (ref 0.450–4.500)

## 2024-07-08 ENCOUNTER — Other Ambulatory Visit: Payer: Self-pay | Admitting: Nurse Practitioner

## 2024-07-08 NOTE — Telephone Encounter (Unsigned)
 Copied from CRM [id number]. Topic: Clinical - Medication Refill >> Jul 08, 2024  1:06 PM Essie A wrote: Medication: levothyroxine  (SYNTHROID , LEVOTHROID) 25 MCG tablet  Has the patient contacted their pharmacy? Yes (Agent: If no, request that the patient contact the pharmacy for the refill. If patient does not wish to contact the pharmacy document the reason why and proceed with request.) (Agent: If yes, when and what did the pharmacy advise?)  This is the patient's preferred pharmacy:  [id number] - [place], [place] - [address] [place] [address] Phone: [phone] Fax: [phone] Is this the correct pharmacy for this prescription? Yes If no, delete pharmacy and type the correct one.   Has the prescription been filled recently? yes  Is the patient out of the medication? Yes with 2 more pills left  Has the patient been seen for an appointment in the last year OR does the patient have an upcoming appointment? Yes  Can we respond through MyChart? No  Agent: Please be advised that Rx refills may take up to 3 business days. We ask that you follow-up with your pharmacy.

## 2024-07-08 NOTE — Telephone Encounter (Signed)
 Med refill

## 2024-07-14 ENCOUNTER — Other Ambulatory Visit: Payer: Self-pay | Admitting: Nurse Practitioner

## 2024-07-14 MED ORDER — LEVOTHYROXINE SODIUM 50 MCG PO TABS: 50.0000 ug | ORAL_TABLET | Freq: Every day | ORAL | 0 refills | Status: DC

## 2024-07-15 NOTE — Progress Notes (Signed)
 The patient attended a screening event on 04/19/2024, where her blood pressure was measured at 126/84 mmHg. During the event, the patient reported that she does not smoke, has insurance, is established with a primary care provider Edwardo Cowden, NP), and does not have any SDOH needs indicated.   A chart review indicates that Nena Cassis. Morton Hummer, NP - [facility] as her PCP. The patients most recent office visit was on 07/01/2024 and she has an upcoming appt with this provider on 10/01/2024. Chart review further indicates that the pt has [facility] as her insurance and a food SDOH need indicated from 05/11/2024 through [facility].   Call Attempt #1: CHW called pt to discuss food SDOH need. CHW left vm for pt to return call. Call Attempt #2: CHW called pt again to discuss food SDOH need. CHW left vm again. Incoming Call: Pt returned call to CHW to discuss food SDOH need. CHW asked pt if she has a food need. Pt shared that she did not mean to indicate the answers and would like to update her responses. CHW asked pt food SDOH questions to update status and indicated that she does not have any additional needs at this time.    At this time, no additional support from the [facility] Team is indicated.

## 2024-07-25 ENCOUNTER — Encounter: Payer: Self-pay | Admitting: Family Medicine

## 2024-07-25 ENCOUNTER — Ambulatory Visit

## 2024-07-25 VITALS — BP 138/82 | HR 79 | Temp 98.0°F | Ht 65.0 in | Wt 190.0 lb

## 2024-07-25 DIAGNOSIS — M0579 Rheumatoid arthritis with rheumatoid factor of multiple sites without organ or systems involvement: Secondary | ICD-10-CM | POA: Diagnosis not present

## 2024-07-25 DIAGNOSIS — M79672 Pain in left foot: Secondary | ICD-10-CM

## 2024-07-25 MED ORDER — METHYLPREDNISOLONE ACETATE 40 MG/ML IJ SUSP
60.0000 mg | Freq: Once | INTRAMUSCULAR | Status: AC
  Administered 2024-07-25: 60 mg via INTRAMUSCULAR

## 2024-07-25 NOTE — Progress Notes (Addendum)
 Subjective:  Patient ID: Claudia Lewis, female    DOB: 1946/09/08, 78 y.o.   MRN: [id number]  Patient Care Team: Deitra Morton Sebastian Nena, NP as PCP - General (Nurse Practitioner) Debera Jayson MATSU, MD as PCP - Cardiology (Cardiology)   Chief Complaint:  Foot Pain   HPI: Claudia Lewis is a 78 y.o. female presenting on 07/25/2024 for Foot Pain   Claudia Lewis is a 78 year old female with rheumatoid arthritis who presents with left foot pain.  She has been experiencing left foot pain since last Thursday, which has progressively worsened to the point where she is unable to walk on it, opting to 'hop' instead to avoid pressure. The pain is described as 'sore', and there are no known injuries to the foot. She has been using Tylenol  at home for pain management.  She has a history of rheumatoid arthritis and is currently on methotrexate for management. She reports frequent flares of her arthritis recently, having been on prednisone  three times, with the most recent course ending on Monday. She took prednisone  for seven days. She also takes duloxetine daily, which helps with her pain.  She mentions a 'knot' on the side of her foot that has been present for about a year. No recent trauma to the area. She has not been using heat or Epsom salt soaks for her arthritis pain.          Relevant past medical, surgical, family, and social history reviewed and updated as indicated.  Allergies and medications reviewed and updated. Data reviewed: Chart in Epic.   Past Medical History:  Diagnosis Date   Anemia    a. noted on 11/2017 labs.   Arthritis    Emphysema lung (HCC)    Hyperlipidemia    Hypokalemia    Hypothyroidism    NSTEMI (non-ST elevated myocardial infarction) (HCC)    a. 11/2017 - cath with normal, tortuous arteries, no culprit, EF normal.    Past Surgical History:  Procedure Laterality Date   ABDOMINAL HYSTERECTOMY     BIOPSY  03/26/2023   Procedure: BIOPSY;   Surgeon: Cindie Carlin POUR, DO;  [facility];  Service: Endoscopy;;  polyp   COLONOSCOPY WITH PROPOFOL  N/A 03/26/2023   Procedure: COLONOSCOPY WITH PROPOFOL ;  Surgeon: Cindie Carlin POUR, DO;  [facility];  Service: Endoscopy;  Laterality: N/A;  2:15 pm   inspire  2023   LEFT HEART CATH AND CORONARY ANGIOGRAPHY N/A 12/03/2017   Procedure: LEFT HEART CATH AND CORONARY ANGIOGRAPHY;  Surgeon: Court Dorn PARAS, MD;  [facility];  Service: Cardiovascular;  Laterality: N/A;   POLYPECTOMY  03/26/2023   Procedure: POLYPECTOMY;  Surgeon: Cindie Carlin POUR, DO;  [facility];  Service: Endoscopy;;    Social History   Socioeconomic History   Marital status: Married    Spouse name: Not on file   Number of children: 3   Years of education: Not on file   Highest education level: Not on file  Occupational History   Not on file  Tobacco Use   Smoking status: Former    Current packs/day: 0.00    Types: Cigarettes    Quit date: 12/03/1988    Years since quitting: 35.6   Smokeless tobacco: Never  Vaping Use   Vaping status: Never Used  Substance and Sexual Activity   Alcohol use: No   Drug use: No   Sexual activity: Not Currently  Other Topics  Concern   Not on file  Social History Narrative   Not on file   Social Drivers of Health   Financial Resource Strain: High Risk (05/11/2024)   Received from [facility]   Overall Financial Resource Strain (CARDIA)    Difficulty of Paying Living Expenses: Hard  Food Insecurity: No Food Insecurity (07/16/2024)   Hunger Vital Sign    Worried About Running Out of Food in the Last Year: Never true    Ran Out of Food in the Last Year: Never true  Recent Concern: Food Insecurity - Food Insecurity Present (05/11/2024)   Received from [facility]   Hunger Vital Sign    Within the past 12 months, you worried that your food would run out before you got the money to buy more.: Sometimes true    Within the  past 12 months, the food you bought just didn't last and you didn't have money to get more.: Sometimes true  Transportation Needs: No Transportation Needs (05/11/2024)   Received from [facility] - Transportation    Lack of Transportation (Medical): No    Lack of Transportation (Non-Medical): No  Physical Activity: Insufficiently Active (05/11/2024)   Received from [facility]   Exercise Vital Sign    On average, how many days per week do you engage in moderate to strenuous exercise (like a brisk walk)?: 3 days    On average, how many minutes do you engage in exercise at this level?: 10 min  Stress: No Stress Concern Present (05/11/2024)   Received from [facility] - Occupational Stress Questionnaire    Feeling of Stress : Only a little  Social Connections: Socially Integrated (05/11/2024)   Received from [facility]   Social Network    How would you rate your social network (family, work, friends)?: Good participation with social networks  Intimate Partner Violence: Not At Risk (05/11/2024)   Received from [facility]   HITS    Over the last 12 months how often did your partner physically hurt you?: Never    Over the last 12 months how often did your partner insult you or talk down to you?: Sometimes    Over the last 12 months how often did your partner threaten you with physical harm?: Never    Over the last 12 months how often did your partner scream or curse at you?: Sometimes    Outpatient Encounter Medications as of 07/25/2024  Medication Sig   acetaminophen  (TYLENOL ) 500 MG tablet Take 500 mg by mouth every 6 (six) hours as needed for mild pain or moderate pain.    aspirin  EC 81 MG tablet Take 81 mg by mouth daily. Swallow whole.   cholecalciferol (VITAMIN D) 1000 units tablet Take 1,000 Units by mouth daily.    DULoxetine (CYMBALTA) 30 MG capsule Take 1 capsule by mouth once daily   ferrous sulfate 325 (65 FE) MG EC  tablet Take 1 tablet by mouth daily with breakfast.   folic acid (FOLVITE) 1 MG tablet Take 1 tablet by mouth daily.   isosorbide  mononitrate (IMDUR ) 30 MG 24 hr tablet TAKE 1/2 TABLET BY MOUTH ONCE DAILY   levothyroxine  (SYNTHROID ) 50 MCG tablet Take 1 tablet (50 mcg total) by mouth daily.   methotrexate (RHEUMATREX) 2.5 MG tablet Take 15 mg by mouth every Sunday. 3 tabs twice daily on Sundays only   pantoprazole  (PROTONIX ) 20 MG tablet Take 1 tablet by mouth daily.   [  EXPIRED] methylPREDNISolone  acetate (DEPO-MEDROL ) injection 60 mg    No facility-administered encounter medications on file as of 07/25/2024.    No Known Allergies  Pertinent ROS per HPI, otherwise unremarkable      Objective:  BP 138/82   Pulse 79   Temp 98 F (36.7 C)   Ht 5' 5 (1.651 m)   Wt 190 lb (86.2 kg)   SpO2 95%   BMI 31.62 kg/m    Wt Readings from Last 3 Encounters:  07/25/24 190 lb (86.2 kg)  07/01/24 197 lb 9.6 oz (89.6 kg)  04/29/24 192 lb 6.4 oz (87.3 kg)    Physical Exam Vitals and nursing note reviewed.  Constitutional:      Appearance: Normal appearance.  HENT:     Head: Normocephalic and atraumatic.     Nose: Nose normal.     Mouth/Throat:     Mouth: Mucous membranes are moist.  Eyes:     Conjunctiva/sclera: Conjunctivae normal.     Pupils: Pupils are equal, round, and reactive to light.  Cardiovascular:     Rate and Rhythm: Normal rate and regular rhythm.     Pulses: Normal pulses.          Dorsalis pedis pulses are 2+ on the left side.       Posterior tibial pulses are 2+ on the left side.     Heart sounds: Normal heart sounds.  Pulmonary:     Effort: Pulmonary effort is normal.     Breath sounds: Normal breath sounds.  Musculoskeletal:     Left foot: Normal range of motion. Bunion present. No deformity, Charcot foot, foot drop or prominent metatarsal heads.       Feet:  Skin:    General: Skin is warm and dry.     Capillary Refill: Capillary refill takes less than 2  seconds.  Neurological:     General: No focal deficit present.     Mental Status: She is alert and oriented to person, place, and time.     Gait: Gait abnormal (antalgic).  Psychiatric:        Mood and Affect: Mood normal.        Behavior: Behavior normal.        Thought Content: Thought content normal.        Judgment: Judgment normal.   Mild tenderness on palpation of the foot. Pain on dorsiflexion of the foot. Bunions present on the foot.        Results for orders placed or performed in visit on 07/01/24  CBC with Differential/Platelet   Collection Time: 07/01/24  3:30 PM  Result Value Ref Range   WBC 3.2 (L) 3.4 - 10.8 x10E3/uL   RBC 4.00 3.77 - 5.28 x10E6/uL   Hemoglobin 10.7 (L) 11.1 - 15.9 g/dL   Hematocrit 64.5 65.9 - 46.6 %   MCV 89 79 - 97 fL   MCH 26.8 26.6 - 33.0 pg   MCHC 30.2 (L) 31.5 - 35.7 g/dL   RDW 82.6 (H) 88.2 - 84.5 %   Platelets 322 150 - 450 x10E3/uL   Neutrophils 47 Not Estab. %   Lymphs 36 Not Estab. %   Monocytes 12 Not Estab. %   Eos 4 Not Estab. %   Basos 1 Not Estab. %   Neutrophils Absolute 1.5 1.4 - 7.0 x10E3/uL   Lymphocytes Absolute 1.2 0.7 - 3.1 x10E3/uL   Monocytes Absolute 0.4 0.1 - 0.9 x10E3/uL   EOS (ABSOLUTE) 0.1 0.0 - 0.4 x10E3/uL  Basophils Absolute 0.0 0.0 - 0.2 x10E3/uL   Immature Granulocytes 0 Not Estab. %   Immature Grans (Abs) 0.0 0.0 - 0.1 x10E3/uL  CMP14+EGFR   Collection Time: 07/01/24  3:30 PM  Result Value Ref Range   Glucose 155 (H) 70 - 99 mg/dL   BUN 17 8 - 27 mg/dL   Creatinine, Ser 8.95 (H) 0.57 - 1.00 mg/dL   eGFR 56 (L) >40 fO/fpw/8.26   BUN/Creatinine Ratio 16 12 - 28   Sodium 141 134 - 144 mmol/L   Potassium 4.5 3.5 - 5.2 mmol/L   Chloride 105 96 - 106 mmol/L   CO2 21 20 - 29 mmol/L   Calcium  9.5 8.7 - 10.3 mg/dL   Total Protein 7.0 6.0 - 8.5 g/dL   Albumin 4.3 3.8 - 4.8 g/dL   Globulin, Total 2.7 1.5 - 4.5 g/dL   Bilirubin Total <9.7 0.0 - 1.2 mg/dL   Alkaline Phosphatase 82 44 - 121 IU/L   AST 22  0 - 40 IU/L   ALT 18 0 - 32 IU/L  Thyroid  Panel With TSH   Collection Time: 07/01/24  3:30 PM  Result Value Ref Range   TSH 2.170 0.450 - 4.500 uIU/mL   T4, Total 7.8 4.5 - 12.0 ug/dL   T3 Uptake Ratio 25 24 - 39 %   Free Thyroxine Index 2.0 1.2 - 4.9  Lipid panel   Collection Time: 07/01/24  3:30 PM  Result Value Ref Range   Cholesterol, Total 145 100 - 199 mg/dL   Triglycerides 793 (H) 0 - 149 mg/dL   HDL 34 (L) >60 mg/dL   VLDL Cholesterol Cal 35 5 - 40 mg/dL   LDL Chol Calc (NIH) 76 0 - 99 mg/dL   Chol/HDL Ratio 4.3 0.0 - 4.4 ratio       Pertinent labs & imaging results that were available during my care of the patient were reviewed by me and considered in my medical decision making.  Assessment & Plan:  Claudia Lewis was seen today for foot pain.  Diagnoses and all orders for this visit:  Rheumatoid arthritis involving multiple sites with positive rheumatoid factor (HCC) -     methylPREDNISolone  acetate (DEPO-MEDROL ) injection 60 mg  Left foot pain -     methylPREDNISolone  acetate (DEPO-MEDROL ) injection 60 mg      Rheumatoid arthritis with foot involvement Recent exacerbation affecting the left foot, causing significant pain and difficulty walking. She has been hopping to avoid pressure on the foot. Current treatment with methotrexate may need adjustment due to worsening symptoms. Bunions may exacerbate the condition. - Administer steroid injection for acute symptom relief without further suppressing the immune system. - Advise use of heat therapy and Epsom salt soaks for pain management. - Follow up with rheumatologist for potential adjustment of methotrexate regimen.  Bunion of foot Bunions present on the left foot for about a year, contributing to discomfort and likely exacerbated by rheumatoid arthritis.          Continue all other maintenance medications.  Follow up plan: Return if symptoms worsen or fail to improve.    The above assessment and management  plan was discussed with the patient. The patient verbalized understanding of and has agreed to the management plan. Patient is aware to call the clinic if they develop any new symptoms or if symptoms persist or worsen. Patient is aware when to return to the clinic for a follow-up visit. Patient educated on when it is appropriate to go to  the emergency department.   Claudia Bruns, FNP-C [facility] [phone]

## 2024-07-29 DIAGNOSIS — Z79899 Other long term (current) drug therapy: Secondary | ICD-10-CM | POA: Diagnosis not present

## 2024-07-29 DIAGNOSIS — D84821 Immunodeficiency due to drugs: Secondary | ICD-10-CM | POA: Diagnosis not present

## 2024-07-29 DIAGNOSIS — M0579 Rheumatoid arthritis with rheumatoid factor of multiple sites without organ or systems involvement: Secondary | ICD-10-CM | POA: Diagnosis not present

## 2024-07-29 DIAGNOSIS — D72819 Decreased white blood cell count, unspecified: Secondary | ICD-10-CM | POA: Diagnosis not present

## 2024-07-31 ENCOUNTER — Other Ambulatory Visit: Payer: Self-pay | Admitting: Nurse Practitioner

## 2024-07-31 MED ORDER — PANTOPRAZOLE SODIUM 20 MG PO TBEC: 20.0000 mg | DELAYED_RELEASE_TABLET | Freq: Every day | ORAL | 1 refills | Status: AC

## 2024-07-31 NOTE — Telephone Encounter (Signed)
 Copied from CRM [id number]. Topic: Clinical - Medication Refill >> Jul 31, 2024  8:41 AM Tiffany S wrote: Medication: pantoprazole  (PROTONIX ) 20 MG tablet [[id number]]  Has the patient contacted their pharmacy? Yes (Agent: If no, request that the patient contact the pharmacy for the refill. If patient does not wish to contact the pharmacy document the reason why and proceed with request.) (Agent: If yes, when and what did the pharmacy advise?)  This is the patient's preferred pharmacy:  [id number] - [place], [place] - [address] [place] [address] Phone: [phone] Fax: [phone]  Is this the correct pharmacy for this prescription? Yes If no, delete pharmacy and type the correct one.   Has the prescription been filled recently? Yes  Is the patient out of the medication? Yes  Has the patient been seen for an appointment in the last year OR does the patient have an upcoming appointment? Yes  Can we respond through MyChart? Yes  Agent: Please be advised that Rx refills may take up to 3 business days. We ask that you follow-up with your pharmacy.

## 2024-08-04 ENCOUNTER — Other Ambulatory Visit: Payer: Self-pay | Admitting: Otolaryngology

## 2024-08-04 DIAGNOSIS — E041 Nontoxic single thyroid nodule: Secondary | ICD-10-CM

## 2024-08-06 ENCOUNTER — Other Ambulatory Visit

## 2024-08-06 ENCOUNTER — Ambulatory Visit
Admission: RE | Admit: 2024-08-06 | Discharge: 2024-08-06 | Disposition: A | Discharge status: Home / Self Care | Source: Ambulatory Visit | Attending: Otolaryngology | Admitting: Otolaryngology

## 2024-08-06 DIAGNOSIS — E041 Nontoxic single thyroid nodule: Secondary | ICD-10-CM

## 2024-08-19 DIAGNOSIS — M79672 Pain in left foot: Secondary | ICD-10-CM | POA: Diagnosis not present

## 2024-08-19 DIAGNOSIS — M25572 Pain in left ankle and joints of left foot: Secondary | ICD-10-CM | POA: Diagnosis not present

## 2024-08-19 DIAGNOSIS — M25571 Pain in right ankle and joints of right foot: Secondary | ICD-10-CM | POA: Diagnosis not present

## 2024-08-19 DIAGNOSIS — M79671 Pain in right foot: Secondary | ICD-10-CM | POA: Diagnosis not present

## 2024-09-03 ENCOUNTER — Encounter: Payer: Self-pay | Admitting: Otolaryngology

## 2024-09-03 ENCOUNTER — Other Ambulatory Visit: Payer: Self-pay | Admitting: Otolaryngology

## 2024-09-03 DIAGNOSIS — E041 Nontoxic single thyroid nodule: Secondary | ICD-10-CM

## 2024-09-03 DIAGNOSIS — M51362 Other intervertebral disc degeneration, lumbar region with discogenic back pain and lower extremity pain: Secondary | ICD-10-CM | POA: Diagnosis not present

## 2024-09-03 DIAGNOSIS — G8929 Other chronic pain: Secondary | ICD-10-CM | POA: Diagnosis not present

## 2024-09-03 DIAGNOSIS — M47816 Spondylosis without myelopathy or radiculopathy, lumbar region: Secondary | ICD-10-CM | POA: Diagnosis not present

## 2024-09-03 DIAGNOSIS — M5416 Radiculopathy, lumbar region: Secondary | ICD-10-CM | POA: Diagnosis not present

## 2024-09-04 DIAGNOSIS — M19012 Primary osteoarthritis, left shoulder: Secondary | ICD-10-CM | POA: Diagnosis not present

## 2024-09-04 DIAGNOSIS — D84821 Immunodeficiency due to drugs: Secondary | ICD-10-CM | POA: Diagnosis not present

## 2024-09-04 DIAGNOSIS — G8929 Other chronic pain: Secondary | ICD-10-CM | POA: Diagnosis not present

## 2024-09-04 DIAGNOSIS — M0579 Rheumatoid arthritis with rheumatoid factor of multiple sites without organ or systems involvement: Secondary | ICD-10-CM | POA: Diagnosis not present

## 2024-09-04 DIAGNOSIS — Z79899 Other long term (current) drug therapy: Secondary | ICD-10-CM | POA: Diagnosis not present

## 2024-09-04 DIAGNOSIS — M25512 Pain in left shoulder: Secondary | ICD-10-CM | POA: Diagnosis not present

## 2024-09-08 DIAGNOSIS — M05712 Rheumatoid arthritis with rheumatoid factor of left shoulder without organ or systems involvement: Secondary | ICD-10-CM | POA: Diagnosis not present

## 2024-09-09 ENCOUNTER — Ambulatory Visit
Admission: RE | Admit: 2024-09-09 | Discharge: 2024-09-09 | Disposition: A | Discharge status: Home / Self Care | Source: Ambulatory Visit | Attending: Otolaryngology | Admitting: Otolaryngology

## 2024-09-09 DIAGNOSIS — E041 Nontoxic single thyroid nodule: Secondary | ICD-10-CM | POA: Diagnosis not present

## 2024-09-11 ENCOUNTER — Ambulatory Visit: Admitting: Gastroenterology

## 2024-09-26 ENCOUNTER — Other Ambulatory Visit (HOSPITAL_COMMUNITY): Payer: Self-pay

## 2024-09-30 NOTE — Progress Notes (Unsigned)
 Subjective:  Patient ID: Claudia Lewis, female    DOB: [birth year], 78 y.o.   MRN: [id number]  Patient Care Team: Deitra Morton Hummer, Nena, NP as PCP - General (Nurse Practitioner) Debera Jayson MATSU, MD as PCP - Cardiology (Cardiology)   Chief Complaint:  No chief complaint on file.   HPI: Claudia Lewis is a 78 y.o. female presenting on 10/01/2024 for No chief complaint on file.   Discussed the use of AI scribe software for clinical note transcription with the patient, who gave verbal consent to proceed.  History of Present Illness     Had lab done 07/01/2024 BG 155  Relevant past medical, surgical, family, and social history reviewed and updated as indicated.  Allergies and medications reviewed and updated. Data reviewed: Chart in Epic.   Past Medical History:  Diagnosis Date   Anemia    a. noted on 11/2017 labs.   Arthritis    Emphysema lung (HCC)    Hyperlipidemia    Hypokalemia    Hypothyroidism    NSTEMI (non-ST elevated myocardial infarction) (HCC)    a. 11/2017 - cath with normal, tortuous arteries, no culprit, EF normal.    Past Surgical History:  Procedure Laterality Date   ABDOMINAL HYSTERECTOMY     BIOPSY  03/26/2023   Procedure: BIOPSY;  Surgeon: Cindie Carlin POUR, DO;  [facility];  Service: Endoscopy;;  polyp   COLONOSCOPY WITH PROPOFOL  N/A 03/26/2023   Procedure: COLONOSCOPY WITH PROPOFOL ;  Surgeon: Cindie Carlin POUR, DO;  [facility];  Service: Endoscopy;  Laterality: N/A;  2:15 pm   inspire  2023   LEFT HEART CATH AND CORONARY ANGIOGRAPHY N/A 12/03/2017   Procedure: LEFT HEART CATH AND CORONARY ANGIOGRAPHY;  Surgeon: Court Dorn PARAS, MD;  [facility];  Service: Cardiovascular;  Laterality: N/A;   POLYPECTOMY  03/26/2023   Procedure: POLYPECTOMY;  Surgeon: Cindie Carlin POUR, DO;  [facility];  Service: Endoscopy;;    Social History   Socioeconomic History   Marital status: Married     Spouse name: Not on file   Number of children: 3   Years of education: Not on file   Highest education level: Not on file  Occupational History   Not on file  Tobacco Use   Smoking status: Former    Current packs/day: 0.00    Types: Cigarettes    Quit date: 12/03/1988    Years since quitting: 35.8   Smokeless tobacco: Never  Vaping Use   Vaping status: Never Used  Substance and Sexual Activity   Alcohol use: No   Drug use: No   Sexual activity: Not Currently  Other Topics Concern   Not on file  Social History Narrative   Not on file   Social Drivers of Health   Financial Resource Strain: High Risk (05/11/2024)   Received from [facility]   Overall Financial Resource Strain (CARDIA)    Difficulty of Paying Living Expenses: Hard  Food Insecurity: No Food Insecurity (07/16/2024)   Hunger Vital Sign    Worried About Running Out of Food in the Last Year: Never true    Ran Out of Food in the Last Year: Never true  Recent Concern: Food Insecurity - Food Insecurity Present (05/11/2024)   Received from [facility]   Hunger Vital Sign    Within the past 12 months, you worried that your food would run out before you got the money to buy  more.: Sometimes true    Within the past 12 months, the food you bought just didn't last and you didn't have money to get more.: Sometimes true  Transportation Needs: No Transportation Needs (05/11/2024)   Received from [facility]   PRAPARE - Transportation    Lack of Transportation (Medical): No    Lack of Transportation (Non-Medical): No  Physical Activity: Insufficiently Active (05/11/2024)   Received from [facility]   Exercise Vital Sign    On average, how many days per week do you engage in moderate to strenuous exercise (like a brisk walk)?: 3 days    On average, how many minutes do you engage in exercise at this level?: 10 min  Stress: No Stress Concern Present (05/11/2024)   Received from [facility]  Occupational [facility] - Occupational Stress Questionnaire    Feeling of Stress : Only a little  Social Connections: Socially Integrated (05/11/2024)   Received from [facility]   Social Network    How would you rate your social network (family, work, friends)?: Good participation with social networks  Intimate Partner Violence: Not At Risk (05/11/2024)   Received from [facility]   HITS    Over the last 12 months how often did your partner physically hurt you?: Never    Over the last 12 months how often did your partner insult you or talk down to you?: Sometimes    Over the last 12 months how often did your partner threaten you with physical harm?: Never    Over the last 12 months how often did your partner scream or curse at you?: Sometimes    Outpatient Encounter Medications as of 10/01/2024  Medication Sig   acetaminophen  (TYLENOL ) 500 MG tablet Take 500 mg by mouth every 6 (six) hours as needed for mild pain or moderate pain.    aspirin  EC 81 MG tablet Take 81 mg by mouth daily. Swallow whole.   cholecalciferol (VITAMIN D) 1000 units tablet Take 1,000 Units by mouth daily.    DULoxetine (CYMBALTA) 30 MG capsule Take 1 capsule by mouth once daily   ferrous sulfate 325 (65 FE) MG EC tablet Take 1 tablet by mouth daily with breakfast.   folic acid (FOLVITE) 1 MG tablet Take 1 tablet by mouth daily.   isosorbide  mononitrate (IMDUR ) 30 MG 24 hr tablet TAKE 1/2 TABLET BY MOUTH ONCE DAILY   levothyroxine  (SYNTHROID ) 50 MCG tablet Take 1 tablet (50 mcg total) by mouth daily.   methotrexate (RHEUMATREX) 2.5 MG tablet Take 15 mg by mouth every Sunday. 3 tabs twice daily on Sundays only   pantoprazole  (PROTONIX ) 20 MG tablet Take 1 tablet (20 mg total) by mouth daily.   No facility-administered encounter medications on file as of 10/01/2024.    No Known Allergies  Pertinent ROS per HPI, otherwise unremarkable      Objective:  There were no vitals taken for this visit.   Wt Readings  from Last 3 Encounters:  07/25/24 190 lb (86.2 kg)  07/01/24 197 lb 9.6 oz (89.6 kg)  04/29/24 192 lb 6.4 oz (87.3 kg)    Physical Exam Physical Exam      Results for orders placed or performed in visit on 07/01/24  CBC with Differential/Platelet   Collection Time: 07/01/24  3:30 PM  Result Value Ref Range   WBC 3.2 (L) 3.4 - 10.8 x10E3/uL   RBC 4.00 3.77 - 5.28 x10E6/uL   Hemoglobin 10.7 (L) 11.1 - 15.9  g/dL   Hematocrit 64.5 65.9 - 46.6 %   MCV 89 79 - 97 fL   MCH 26.8 26.6 - 33.0 pg   MCHC 30.2 (L) 31.5 - 35.7 g/dL   RDW 82.6 (H) 88.2 - 84.5 %   Platelets 322 150 - 450 x10E3/uL   Neutrophils 47 Not Estab. %   Lymphs 36 Not Estab. %   Monocytes 12 Not Estab. %   Eos 4 Not Estab. %   Basos 1 Not Estab. %   Neutrophils Absolute 1.5 1.4 - 7.0 x10E3/uL   Lymphocytes Absolute 1.2 0.7 - 3.1 x10E3/uL   Monocytes Absolute 0.4 0.1 - 0.9 x10E3/uL   EOS (ABSOLUTE) 0.1 0.0 - 0.4 x10E3/uL   Basophils Absolute 0.0 0.0 - 0.2 x10E3/uL   Immature Granulocytes 0 Not Estab. %   Immature Grans (Abs) 0.0 0.0 - 0.1 x10E3/uL  CMP14+EGFR   Collection Time: 07/01/24  3:30 PM  Result Value Ref Range   Glucose 155 (H) 70 - 99 mg/dL   BUN 17 8 - 27 mg/dL   Creatinine, Ser 8.95 (H) 0.57 - 1.00 mg/dL   eGFR 56 (L) >40 fO/fpw/8.26   BUN/Creatinine Ratio 16 12 - 28   Sodium 141 134 - 144 mmol/L   Potassium 4.5 3.5 - 5.2 mmol/L   Chloride 105 96 - 106 mmol/L   CO2 21 20 - 29 mmol/L   Calcium  9.5 8.7 - 10.3 mg/dL   Total Protein 7.0 6.0 - 8.5 g/dL   Albumin 4.3 3.8 - 4.8 g/dL   Globulin, Total 2.7 1.5 - 4.5 g/dL   Bilirubin Total <9.7 0.0 - 1.2 mg/dL   Alkaline Phosphatase 82 44 - 121 IU/L   AST 22 0 - 40 IU/L   ALT 18 0 - 32 IU/L  Thyroid  Panel With TSH   Collection Time: 07/01/24  3:30 PM  Result Value Ref Range   TSH 2.170 0.450 - 4.500 uIU/mL   T4, Total 7.8 4.5 - 12.0 ug/dL   T3 Uptake Ratio 25 24 - 39 %   Free Thyroxine Index 2.0 1.2 - 4.9  Lipid panel   Collection Time:  07/01/24  3:30 PM  Result Value Ref Range   Cholesterol, Total 145 100 - 199 mg/dL   Triglycerides 793 (H) 0 - 149 mg/dL   HDL 34 (L) >60 mg/dL   VLDL Cholesterol Cal 35 5 - 40 mg/dL   LDL Chol Calc (NIH) 76 0 - 99 mg/dL   Chol/HDL Ratio 4.3 0.0 - 4.4 ratio       Pertinent labs & imaging results that were available during my care of the patient were reviewed by me and considered in my medical decision making.  Assessment & Plan:  There are no diagnoses linked to this encounter.   Assessment and Plan Assessment & Plan       Continue all other maintenance medications.  Follow up plan: No follow-ups on file.   Continue healthy lifestyle choices, including diet (rich in fruits, vegetables, and lean proteins, and low in salt and simple carbohydrates) and exercise (at least 30 minutes of moderate physical activity daily).  Educational handout given for ***  The above assessment and management plan was discussed with the patient. The patient verbalized understanding of and has agreed to the management plan. Patient is aware to call the clinic if they develop any new symptoms or if symptoms persist or worsen. Patient is aware when to return to the clinic for a follow-up visit. Patient educated  on when it is appropriate to go to the emergency department.  @SIGNATURE @

## 2024-10-01 ENCOUNTER — Encounter: Payer: Self-pay | Admitting: Nurse Practitioner

## 2024-10-01 ENCOUNTER — Ambulatory Visit (INDEPENDENT_AMBULATORY_CARE_PROVIDER_SITE_OTHER): Payer: Self-pay | Admitting: Nurse Practitioner

## 2024-10-01 VITALS — BP 136/74 | HR 71 | Temp 97.5°F | Ht 65.0 in | Wt 190.8 lb

## 2024-10-01 DIAGNOSIS — Z6832 Body mass index (BMI) 32.0-32.9, adult: Secondary | ICD-10-CM

## 2024-10-01 DIAGNOSIS — Z23 Encounter for immunization: Secondary | ICD-10-CM

## 2024-10-01 DIAGNOSIS — E66811 Obesity, class 1: Secondary | ICD-10-CM

## 2024-10-01 DIAGNOSIS — D508 Other iron deficiency anemias: Secondary | ICD-10-CM | POA: Diagnosis not present

## 2024-10-01 DIAGNOSIS — E039 Hypothyroidism, unspecified: Secondary | ICD-10-CM | POA: Diagnosis not present

## 2024-10-01 DIAGNOSIS — J441 Chronic obstructive pulmonary disease with (acute) exacerbation: Secondary | ICD-10-CM

## 2024-10-01 DIAGNOSIS — E785 Hyperlipidemia, unspecified: Secondary | ICD-10-CM

## 2024-10-01 DIAGNOSIS — M5412 Radiculopathy, cervical region: Secondary | ICD-10-CM

## 2024-10-01 DIAGNOSIS — F330 Major depressive disorder, recurrent, mild: Secondary | ICD-10-CM

## 2024-10-01 DIAGNOSIS — K219 Gastro-esophageal reflux disease without esophagitis: Secondary | ICD-10-CM

## 2024-10-01 DIAGNOSIS — E1169 Type 2 diabetes mellitus with other specified complication: Secondary | ICD-10-CM

## 2024-10-01 DIAGNOSIS — R7309 Other abnormal glucose: Secondary | ICD-10-CM | POA: Insufficient documentation

## 2024-10-01 DIAGNOSIS — E119 Type 2 diabetes mellitus without complications: Secondary | ICD-10-CM | POA: Insufficient documentation

## 2024-10-01 LAB — LIPID PANEL

## 2024-10-01 LAB — BAYER DCA HB A1C WAIVED: HB A1C (BAYER DCA - WAIVED): 6.1 % — ABNORMAL HIGH (ref 4.8–5.6)

## 2024-10-01 MED ORDER — DAPAGLIFLOZIN PROPANEDIOL 5 MG PO TABS: 5.0000 mg | ORAL_TABLET | Freq: Every day | ORAL | 1 refills | Status: AC

## 2024-10-01 NOTE — Assessment & Plan Note (Signed)
 Diet and exercise Walk 15-30 minutes as tolerated 3-4 times weekly;  heart healthy diet

## 2024-10-02 ENCOUNTER — Ambulatory Visit: Payer: Self-pay | Admitting: Nurse Practitioner

## 2024-10-02 LAB — ANEMIA PROFILE B
Basophils Absolute: 0 x10E3/uL (ref 0.0–0.2)
Basos: 1 %
EOS (ABSOLUTE): 0 x10E3/uL (ref 0.0–0.4)
Eos: 1 %
Ferritin: 240 ng/mL — AB (ref 15–150)
Folate: >20 ng/mL (ref >3.0)
Hematocrit: 33.6 % — ABNORMAL LOW (ref 34.0–46.6)
Hemoglobin: 10.7 g/dL — ABNORMAL LOW (ref 11.1–15.9)
Immature Grans (Abs): 0 x10E3/uL (ref 0.0–0.1)
Immature Granulocytes: 0 %
Iron Saturation: 8 % — AB (ref 15–55)
Iron: 22 ug/dL — AB (ref 27–139)
Lymphocytes Absolute: 1.1 x10E3/uL (ref 0.7–3.1)
Lymphs: 18 %
MCH: 28.5 pg (ref 26.6–33.0)
MCHC: 31.8 g/dL (ref 31.5–35.7)
MCV: 90 fL (ref 79–97)
Monocytes Absolute: 0.4 x10E3/uL (ref 0.1–0.9)
Monocytes: 7 %
Neutrophils Absolute: 4.4 x10E3/uL (ref 1.4–7.0)
Neutrophils: 73 %
Platelets: 336 x10E3/uL (ref 150–450)
RBC: 3.75 x10E6/uL — ABNORMAL LOW (ref 3.77–5.28)
RDW: 15.7 % — ABNORMAL HIGH (ref 11.7–15.4)
Retic Ct Pct: 1.9 % (ref 0.6–2.6)
Total Iron Binding Capacity: 260 ug/dL (ref 250–450)
UIBC: 238 ug/dL (ref 118–369)
Vitamin B-12: >2000 pg/mL — AB (ref 232–1245)
WBC: 5.9 x10E3/uL (ref 3.4–10.8)

## 2024-10-02 LAB — CMP14+EGFR
ALT: 21 IU/L (ref 0–32)
AST: 18 IU/L (ref 0–40)
Albumin: 4.3 g/dL (ref 3.8–4.8)
Alkaline Phosphatase: 67 IU/L (ref 49–135)
BUN/Creatinine Ratio: 17 (ref 12–28)
BUN: 13 mg/dL (ref 8–27)
Bilirubin Total: 0.2 mg/dL (ref 0.0–1.2)
CO2: 20 mmol/L (ref 20–29)
Calcium: 9.6 mg/dL (ref 8.7–10.3)
Chloride: 104 mmol/L (ref 96–106)
Creatinine, Ser: 0.77 mg/dL (ref 0.57–1.00)
Globulin, Total: 2.4 g/dL (ref 1.5–4.5)
Glucose: 74 mg/dL (ref 70–99)
Potassium: 4.4 mmol/L (ref 3.5–5.2)
Sodium: 141 mmol/L (ref 134–144)
Total Protein: 6.7 g/dL (ref 6.0–8.5)
eGFR: 80 mL/min/1.73 (ref >59)

## 2024-10-02 LAB — LIPID PANEL
Cholesterol, Total: 171 mg/dL (ref 100–199)
HDL: 47 mg/dL (ref >39)
LDL CALC COMMENT:: 3.6 ratio (ref 0.0–4.4)
LDL Chol Calc (NIH): 102 mg/dL — AB (ref 0–99)
Triglycerides: 122 mg/dL (ref 0–149)
VLDL Cholesterol Cal: 22 mg/dL (ref 5–40)

## 2024-10-06 ENCOUNTER — Other Ambulatory Visit: Payer: Self-pay | Admitting: *Deleted

## 2024-10-06 LAB — SEDIMENTATION RATE

## 2024-10-06 LAB — C-REACTIVE PROTEIN: CRP: 11 mg/L — ABNORMAL HIGH (ref 0–10)

## 2024-10-06 LAB — SPECIMEN STATUS REPORT

## 2024-10-17 ENCOUNTER — Ambulatory Visit (INDEPENDENT_AMBULATORY_CARE_PROVIDER_SITE_OTHER): Payer: Self-pay

## 2024-10-17 VITALS — BP 136/74 | HR 71 | Ht 65.0 in | Wt 190.0 lb

## 2024-10-17 DIAGNOSIS — Z0000 Encounter for general adult medical examination without abnormal findings: Secondary | ICD-10-CM

## 2024-10-17 NOTE — Progress Notes (Signed)
 Chief Complaint  Patient presents with   Medicare Wellness     Subjective:   Claudia Lewis is a 78 y.o. female who presents for a Medicare Annual Wellness Visit.  Allergies (verified) Patient has no known allergies.   History: Past Medical History:  Diagnosis Date   Anemia    a. noted on 11/2017 labs.   Arthritis    Emphysema lung (HCC)    Hyperlipidemia    Hypokalemia    Hypothyroidism    NSTEMI (non-ST elevated myocardial infarction) (HCC)    a. 11/2017 - cath with normal, tortuous arteries, no culprit, EF normal.   Past Surgical History:  Procedure Laterality Date   ABDOMINAL HYSTERECTOMY     BIOPSY  03/26/2023   Procedure: BIOPSY;  Surgeon: Cindie Carlin POUR, DO;  Location: [facility];  Service: Endoscopy;;  polyp   COLONOSCOPY WITH PROPOFOL  N/A 03/26/2023   Procedure: COLONOSCOPY WITH PROPOFOL ;  Surgeon: Cindie Carlin POUR, DO;  Location: [facility];  Service: Endoscopy;  Laterality: N/A;  2:15 pm   inspire  2023   LEFT HEART CATH AND CORONARY ANGIOGRAPHY N/A 12/03/2017   Procedure: LEFT HEART CATH AND CORONARY ANGIOGRAPHY;  Surgeon: Court Dorn PARAS, MD;  Location: [facility];  Service: Cardiovascular;  Laterality: N/A;   POLYPECTOMY  03/26/2023   Procedure: POLYPECTOMY;  Surgeon: Cindie Carlin POUR, DO;  Location: [facility];  Service: Endoscopy;;   Family History  Problem Relation Age of Onset   Hypertension Mother    Colon cancer Neg Hx    Social History   Occupational History   Not on file  Tobacco Use   Smoking status: Former    Current packs/day: 0.00    Types: Cigarettes    Quit date: 12/03/1988    Years since quitting: 35.8   Smokeless tobacco: Never  Vaping Use   Vaping status: Never Used  Substance and Sexual Activity   Alcohol use: No   Drug use: No   Sexual activity: Not Currently   Tobacco Counseling Counseling given: Yes  SDOH Screenings   Food Insecurity: No Food Insecurity (10/17/2024)  Housing: Unknown  (10/17/2024)  Transportation Needs: No Transportation Needs (10/17/2024)  Utilities: Not At Risk (10/17/2024)  Depression (PHQ2-9): Low Risk  (10/17/2024)  Recent Concern: Depression (PHQ2-9) - Medium Risk (07/25/2024)  Financial Resource Strain: High Risk (05/11/2024)   Received from [facility]  Physical Activity: Inactive (10/17/2024)  Social Connections: Socially Integrated (10/17/2024)  Stress: No Stress Concern Present (10/17/2024)  Tobacco Use: Medium Risk (10/17/2024)  Health Literacy: Adequate Health Literacy (10/17/2024)   See flowsheets for full screening details  Depression Screen PHQ 2 & 9 Depression Scale- Over the past 2 weeks, how often have you been bothered by any of the following problems? Little interest or pleasure in doing things: 0 Feeling down, depressed, or hopeless (PHQ Adolescent also includes...irritable): 1 PHQ-2 Total Score: 1 Trouble falling or staying asleep, or sleeping too much: 0 Feeling tired or having little energy: 1 Poor appetite or overeating (PHQ Adolescent also includes...weight loss): 0 Feeling bad about yourself - or that you are a failure or have let yourself or your family down: 0 Trouble concentrating on things, such as reading the newspaper or watching television (PHQ Adolescent also includes...like school work): 0 Moving or speaking so slowly that other people could have noticed. Or the opposite - being so fidgety or restless that you have been moving around a lot more than usual: 0 Thoughts that you  would be better off dead, or of hurting yourself in some way: 0 PHQ-9 Total Score: 2 If you checked off any problems, how difficult have these problems made it for you to do your work, take care of things at home, or get along with other people?: Not difficult at all  Depression Treatment Depression Interventions/Treatment : Currently on Treatment     Goals Addressed             This Visit's Progress    Stay Active and Independent          Visit info / Clinical Intake: Medicare Wellness Visit Type:: Subsequent Annual Wellness Visit Persons participating in visit:: patient Medicare Wellness Visit Mode:: Telephone If telephone:: video declined Because this visit was a virtual/telehealth visit:: vitals recorded from last visit If Telephone or Video please confirm:: I connected with the patient using audio enabled telemedicine application and verified that I am speaking with the correct person using two identifiers Patient Location:: home Provider Location:: home office Information given by:: patient Interpreter Needed?: No Pre-visit prep was completed: yes AWV questionnaire completed by patient prior to visit?: no Living arrangements:: lives with spouse/significant other Patient's Overall Health Status Rating: very good Typical amount of pain: none Does pain affect daily life?: no Are you currently prescribed opioids?: no  Dietary Habits and Nutritional Risks How many meals a day?: 2 Eats fruit and vegetables daily?: yes Most meals are obtained by: preparing own meals In the last 2 weeks, have you had any of the following?: none Diabetic:: (!) yes Any non-healing wounds?: (!) yes Would you like to be referred to a Nutritionist or for Diabetic Management? : no  Functional Status Activities of Daily Living (to include ambulation/medication): Independent Ambulation: Independent Medication Administration: Independent Home Management: Independent Manage your own finances?: yes Primary transportation is: driving Concerns about hearing?: no  Fall Screening Falls in the past year?: 0 Number of falls in past year: 0 Was there an injury with Fall?: 0 Fall Risk Category Calculator: 0 Patient Fall Risk Level: Low Fall Risk  Fall Risk Patient at Risk for Falls Due to: No Fall Risks Fall risk Follow up: Falls evaluation completed; Education provided  Home and Transportation Safety: All rugs have non-skid  backing?: yes All stairs or steps have railings?: yes Grab bars in the bathtub or shower?: (!) no Have non-skid surface in bathtub or shower?: yes Good home lighting?: yes Regular seat belt use?: yes Hospital stays in the last year:: no  Cognitive Assessment Difficulty concentrating, remembering, or making decisions? : no Will 6CIT or Mini Cog be Completed: yes What year is it?: 0 points What month is it?: 0 points Give patient an address phrase to remember (5 components): [address] [place], [place] About what time is it?: 0 points Count backwards from 20 to 1: 0 points Say the months of the year in reverse: 0 points Repeat the address phrase from earlier: 0 points 6 CIT Score: 0 points  Advance Directives (For Healthcare) Does Patient Have a Medical Advance Directive?: No Would patient like information on creating a medical advance directive?: No - Patient declined  Reviewed/Updated  Reviewed/Updated: Medical History; Surgical History; Family History; Medications; Allergies; Patient Goals; Care Teams; Reviewed All (Medical, Surgical, Family, Medications, Allergies, Care Teams, Patient Goals)        Objective:    Today's Vitals   10/17/24 1311  BP: 136/74  Pulse: 71  Weight: 190 lb (86.2 kg)  Height: 5' 5 (1.651 m)  Body mass index is 31.62 kg/m.  Current Medications (verified) Outpatient Encounter Medications as of 10/17/2024  Medication Sig   acetaminophen  (TYLENOL ) 500 MG tablet Take 500 mg by mouth every 6 (six) hours as needed for mild pain or moderate pain.    aspirin  EC 81 MG tablet Take 81 mg by mouth daily. Swallow whole.   cholecalciferol (VITAMIN D) 1000 units tablet Take 1,000 Units by mouth daily.    dapagliflozin  propanediol (FARXIGA ) 5 MG TABS tablet Take 1 tablet (5 mg total) by mouth daily.   DULoxetine (CYMBALTA) 30 MG capsule Take 1 capsule by mouth once daily   ferrous sulfate 325 (65 FE) MG EC tablet Take 1 tablet by mouth daily with breakfast.    folic acid (FOLVITE) 1 MG tablet Take 1 tablet by mouth daily.   isosorbide  mononitrate (IMDUR ) 30 MG 24 hr tablet TAKE 1/2 TABLET BY MOUTH ONCE DAILY   levothyroxine  (SYNTHROID ) 50 MCG tablet Take 1 tablet by mouth once daily   methotrexate (RHEUMATREX) 2.5 MG tablet Take 15 mg by mouth every Sunday. 3 tabs twice daily on Sundays only   pantoprazole  (PROTONIX ) 20 MG tablet Take 1 tablet (20 mg total) by mouth daily.   No facility-administered encounter medications on file as of 10/17/2024.   Hearing/Vision screen Hearing Screening - Comments:: Pt has hearing dif Vision Screening - Comments:: Pt wear glasses/pl [facility] In [place], [place]/last 2025 Immunizations and Health Maintenance Health Maintenance  Topic Date Due   Diabetic kidney evaluation - Urine ACR  Never done   COVID-19 Vaccine (5 - 2025-26 season) 10/17/2024 (Originally 07/28/2024)   OPHTHALMOLOGY EXAM  01/21/2025 (Originally 03/23/1956)   DTaP/Tdap/Td (1 - Tdap) 07/01/2025 (Originally 03/24/1965)   Hepatitis C Screening  07/01/2025 (Originally 03/23/1964)   HEMOGLOBIN A1C  03/31/2025   Diabetic kidney evaluation - eGFR measurement  10/01/2025   FOOT EXAM  10/01/2025   Medicare Annual Wellness (AWV)  10/17/2025   Pneumococcal Vaccine: 50+ Years  Completed   Influenza Vaccine  Completed   Bone Density Scan  Completed   Zoster Vaccines- Shingrix  Completed   Meningococcal B Vaccine  Aged Out   Colonoscopy  Discontinued        Assessment/Plan:  This is a routine wellness examination for [facility].  Patient Care Team: Deitra Morton Hummer, Nena, NP as PCP - General (Nurse Practitioner) Debera Jayson MATSU, MD as PCP - Cardiology (Cardiology)  I have personally reviewed and noted the following in the patient's chart:   Medical and social history Use of alcohol, tobacco or illicit drugs  Current medications and supplements including opioid prescriptions. Functional ability and status Nutritional status Physical  activity Advanced directives List of other physicians Hospitalizations, surgeries, and ER visits in previous 12 months Vitals Screenings to include cognitive, depression, and falls Referrals and appointments  No orders of the defined types were placed in this encounter.  In addition, I have reviewed and discussed with patient certain preventive protocols, quality metrics, and best practice recommendations. A written personalized care plan for preventive services as well as general preventive health recommendations were provided to patient.   Ozie Ned, CMA   10/17/2024   Return in 1 year (on 10/17/2025).  After Visit Summary: (MyChart) Due to this being a telephonic visit, the after visit summary with patients personalized plan was offered to patient via MyChart   Nurse Notes: n/a

## 2024-11-06 ENCOUNTER — Ambulatory Visit: Admitting: Gastroenterology

## 2024-11-07 ENCOUNTER — Encounter: Payer: Self-pay | Admitting: Gastroenterology

## 2024-11-07 LAB — LAB REPORT - SCANNED: Microalb Creat Ratio: 4

## 2024-11-18 ENCOUNTER — Other Ambulatory Visit: Payer: Self-pay | Admitting: Nurse Practitioner

## 2024-11-18 MED ORDER — DULOXETINE HCL 30 MG PO CPEP: 30.0000 mg | ORAL_CAPSULE | Freq: Every day | ORAL | 0 refills | Status: AC

## 2024-11-18 NOTE — Telephone Encounter (Signed)
" °  Prescription Request  11/18/2024  Is this a Controlled Substance medicine?   Have you seen your PCP in the last 2 weeks? 10/01/24  If YES, route message to pool  -  If NO, patient needs to be scheduled for appointment.  What is the name of the medication or equipment? Duloxetine  dr 30mg  cap  Have you contacted your pharmacy to request a refill? yes  Which pharmacy would you like this sent to? [facility]   Patient notified that their request is being sent to the clinical staff for review and that they should receive a response within 2 business days.     "

## 2024-11-26 ENCOUNTER — Telehealth: Payer: Self-pay | Admitting: Gastroenterology

## 2024-11-26 NOTE — Telephone Encounter (Signed)
 Claudia Lewis, can we arrange an office visit due to IDA? Thanks!

## 2024-12-02 ENCOUNTER — Encounter: Payer: Self-pay | Admitting: *Deleted

## 2024-12-11 ENCOUNTER — Encounter: Payer: Self-pay | Admitting: *Deleted

## 2024-12-31 ENCOUNTER — Ambulatory Visit: Admitting: Nurse Practitioner

## 2025-01-01 ENCOUNTER — Ambulatory Visit: Admitting: Nurse Practitioner

## 2025-01-02 ENCOUNTER — Ambulatory Visit: Admitting: Family Medicine

## 2025-01-07 ENCOUNTER — Ambulatory Visit: Admitting: Family Medicine

## 2025-01-21 ENCOUNTER — Ambulatory Visit: Admitting: Gastroenterology
